# Patient Record
Sex: Male | Born: 1955 | Race: White | Hispanic: No | Marital: Married | Smoking: Never smoker
Health system: Southern US, Community
[De-identification: ages and names within clinical notes are randomized; demographics above are authoritative.]

## PROBLEM LIST (undated history)

## (undated) DIAGNOSIS — J189 Pneumonia, unspecified organism: Secondary | ICD-10-CM

## (undated) DIAGNOSIS — M1990 Unspecified osteoarthritis, unspecified site: Secondary | ICD-10-CM

## (undated) DIAGNOSIS — Z8619 Personal history of other infectious and parasitic diseases: Secondary | ICD-10-CM

## (undated) DIAGNOSIS — K219 Gastro-esophageal reflux disease without esophagitis: Secondary | ICD-10-CM

## (undated) DIAGNOSIS — R519 Headache, unspecified: Secondary | ICD-10-CM

## (undated) DIAGNOSIS — E7800 Pure hypercholesterolemia, unspecified: Secondary | ICD-10-CM

## (undated) DIAGNOSIS — Z9109 Other allergy status, other than to drugs and biological substances: Secondary | ICD-10-CM

## (undated) DIAGNOSIS — G8929 Other chronic pain: Secondary | ICD-10-CM

## (undated) DIAGNOSIS — R42 Dizziness and giddiness: Secondary | ICD-10-CM

## (undated) DIAGNOSIS — G4730 Sleep apnea, unspecified: Secondary | ICD-10-CM

## (undated) DIAGNOSIS — F419 Anxiety disorder, unspecified: Secondary | ICD-10-CM

## (undated) DIAGNOSIS — R51 Headache: Secondary | ICD-10-CM

## (undated) HISTORY — PX: APPENDECTOMY: SHX54

## (undated) HISTORY — DX: Other chronic pain: G89.29

## (undated) HISTORY — DX: Dizziness and giddiness: R42

## (undated) HISTORY — DX: Headache: R51

## (undated) HISTORY — PX: EYE SURGERY: SHX253

## (undated) HISTORY — PX: ROTATOR CUFF REPAIR: SHX139

## (undated) HISTORY — DX: Headache, unspecified: R51.9

## (undated) HISTORY — DX: Anxiety disorder, unspecified: F41.9

## (undated) HISTORY — DX: Sleep apnea, unspecified: G47.30

## (undated) HISTORY — DX: Other allergy status, other than to drugs and biological substances: Z91.09

## (undated) HISTORY — PX: OTHER SURGICAL HISTORY: SHX169

## (undated) HISTORY — DX: Pure hypercholesterolemia, unspecified: E78.00

---

## 2001-11-17 ENCOUNTER — Encounter: Admission: RE | Admit: 2001-11-17 | Discharge: 2001-11-17 | Payer: Self-pay | Admitting: Internal Medicine

## 2001-11-17 ENCOUNTER — Encounter: Payer: Self-pay | Admitting: Internal Medicine

## 2005-09-16 ENCOUNTER — Encounter: Admission: RE | Admit: 2005-09-16 | Discharge: 2005-09-16 | Payer: Self-pay | Admitting: Sports Medicine

## 2005-10-08 ENCOUNTER — Encounter: Admission: RE | Admit: 2005-10-08 | Discharge: 2005-10-08 | Payer: Self-pay | Admitting: Sports Medicine

## 2011-02-10 ENCOUNTER — Ambulatory Visit (INDEPENDENT_AMBULATORY_CARE_PROVIDER_SITE_OTHER): Payer: BC Managed Care – PPO | Admitting: Internal Medicine

## 2011-02-10 ENCOUNTER — Encounter: Payer: Self-pay | Admitting: Internal Medicine

## 2011-02-10 VITALS — BP 120/72 | HR 68 | Ht 74.0 in | Wt 234.6 lb

## 2011-02-10 DIAGNOSIS — G4733 Obstructive sleep apnea (adult) (pediatric): Secondary | ICD-10-CM

## 2011-02-10 NOTE — Patient Instructions (Signed)
Order- new CPAP autotitration for pressure recommendation 5-20 cwp x 2 weeks., mask of choice, humidifier and supplies   Dx OSA

## 2011-02-10 NOTE — Progress Notes (Signed)
02/10/11- 55 yoM never smoker identified with OSA AHI 28.4 on a home unattended test done 11/09/10. Referred by Dr Zenaida Deed for our assessment. His wife has complained of loud snoring and witnessed apnea for years.  He admits poor sleep habits. Unable to on wind for sleep before midnight or 1 AM. Sleep latency 15 minutes. Often spontaneously awake once around 2 AM but otherwise sleeps through the night with nocturia x1 until up at 7:30. No caffeine before noon. Always fights daytime tiredness and has been drowsy driving which we discussed. He estimates he allergies 5-1/2-6 hours of sleep per night. On weekends he gets 8-10 hours. 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 No history of ENT surgery or cardiopulmonary disease. Family history of father with sleep apnea on CPAP.  ROS-see HPI Constitutional:   No-   weight loss, night sweats, fevers, chills, fatigue, lassitude. HEENT:   +  headaches, difficulty swallowing, tooth/dental problems, sore throat,       No-  sneezing, itching, ear ache, nasal congestion, post nasal drip,  CV:  No-   chest pain, orthopnea, PND, swelling in lower extremities, anasarca, dizziness, palpitations Resp: No-   shortness of breath with exertion or at rest.              No-   productive cough,  No non-productive cough,  No- coughing up of blood.              No-   change in color of mucus.  No- wheezing.   Skin: No-   rash or lesions. GI:  No-   heartburn, indigestion, abdominal pain, nausea, vomiting, diarrhea,                 change in bowel habits, loss of appetite GU: MS:  No-   joint pain or swelling.  No- decreased range of motion.  No- back pain. Neuro-     nothing unusual Psych:  No- change in mood or affect. No depression or anxiety.  No memory loss.  OBJ General- Alert, Oriented, Affect-appropriate, Distress- none acute, tall, energetic, not obese Skin- rash-none, lesions- none,  excoriation- none Lymphadenopathy- none Head- atraumatic            Eyes- Gross vision intact, PERRLA, conjunctivae clear secretions            Ears- Hearing, canals-normal            Nose- Clear, no-Septal dev, mucus, polyps, erosion, perforation             Throat- Mallampati III , mucosa clear , drainage- none, tonsils- atrophic Neck- flexible , trachea midline, no stridor , thyroid nl, carotid no bruit Chest - symmetrical excursion , unlabored           Heart/CV- RRR , no murmur , no gallop  , no rub, nl s1 s2                           - JVD- none , edema- none, stasis changes- none, varices- none           Lung- clear to P&A, wheeze- none, cough- none , dullness-none, rub- none           Chest wall-  Abd-  Br/ Gen/ Rectal- Not done, not indicated Extrem- cyanosis- none, clubbing, none, atrophy- none, strength- nl Neuro- grossly intact to observation

## 2011-02-11 NOTE — Assessment & Plan Note (Signed)
We reviewed the physiology and medical concerns of obstructive sleep apnea, his responsibility to drive safely, good sleep hygiene, and treatment options. He wishes to go forward with CPAP. We will auto titrate for pressure guidance.

## 2011-02-17 ENCOUNTER — Telehealth: Payer: Self-pay | Admitting: Internal Medicine

## 2011-02-17 NOTE — Telephone Encounter (Signed)
Called and spoke with Jasmine December at [employer] and they have contacted patient per their records. [facility] stated that they left a message for him to return their call but couldn't verify which phone number they called. [facility] received the order on 02/10/11 and started calling on 02/12/11. [facility] has since mailed out a letter for him to contact them regarding the order.  Per sharon at [facility], she will call patient on mobile number now and try to arrange auto.

## 2011-02-17 NOTE — Telephone Encounter (Signed)
I spoke with [facility] and they state that they never received fax on 02/10/11 and asked if we could refax the order. I spoke with Bjorn Loser and she states she will refax the order over to them.--[email or web address] to make pt aware

## 2011-02-18 NOTE — Telephone Encounter (Signed)
Spoke with Jasmine December at [facility] and patient has been contacted and patient will call Victorino Dike, RT with [facility] in the morning to schedule cpap set up. Pt is aware.

## 2011-02-18 NOTE — Telephone Encounter (Signed)
Pt stated that he has been contacted the [facility] & that everything seems to be getting taken care of now.   Antionette Fairy

## 2011-03-24 ENCOUNTER — Ambulatory Visit: Payer: BC Managed Care – PPO | Admitting: Internal Medicine

## 2011-03-24 ENCOUNTER — Other Ambulatory Visit: Payer: Self-pay | Admitting: Internal Medicine

## 2011-03-24 ENCOUNTER — Ambulatory Visit
Admission: RE | Admit: 2011-03-24 | Discharge: 2011-03-24 | Disposition: A | Discharge status: Home / Self Care | Payer: BC Managed Care – PPO | Source: Ambulatory Visit | Attending: Internal Medicine | Admitting: Internal Medicine

## 2011-03-24 DIAGNOSIS — M1990 Unspecified osteoarthritis, unspecified site: Secondary | ICD-10-CM

## 2011-04-07 ENCOUNTER — Ambulatory Visit (INDEPENDENT_AMBULATORY_CARE_PROVIDER_SITE_OTHER): Payer: BC Managed Care – PPO | Admitting: Internal Medicine

## 2011-04-07 ENCOUNTER — Encounter: Payer: Self-pay | Admitting: Internal Medicine

## 2011-04-07 VITALS — BP 122/80 | HR 52 | Ht 74.0 in | Wt 242.0 lb

## 2011-04-07 DIAGNOSIS — J302 Other seasonal allergic rhinitis: Secondary | ICD-10-CM

## 2011-04-07 DIAGNOSIS — G4733 Obstructive sleep apnea (adult) (pediatric): Secondary | ICD-10-CM

## 2011-04-07 DIAGNOSIS — J309 Allergic rhinitis, unspecified: Secondary | ICD-10-CM

## 2011-04-07 NOTE — Progress Notes (Signed)
02/10/11- 55 yoM never smoker identified with OSA AHI 28.4 on a home unattended test done 11/09/10. Referred by Dr Zenaida Deed for our assessment. His wife has complained of loud snoring and witnessed apnea for years.  He admits poor sleep habits. Unable to on wind for sleep before midnight or 1 AM. Sleep latency 15 minutes. Often spontaneously awake once around 2 AM but otherwise sleeps through the night with nocturia x1 until up at 7:30. No caffeine before noon. Always fights daytime tiredness and has been drowsy driving which we discussed. He estimates he allergies 5-1/2-6 hours of sleep per night. On weekends he gets 8-10 hours. 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 No history of ENT surgery or cardiopulmonary disease. Family history of father with sleep apnea on CPAP.  04/07/11- 55 yoM never smoker identified with OSA AHI 28.4 on a home unattended test done 11/09/10 Very irregular sleep hours uses CPAP every night, estimating 7 hours per night. The initial pressure is too strong for comfort using auto titration. Nasal pillows mask. Anticipates rhinitis and spring weather and we discussed affect of nasal congestion.  ROS-see HPI Constitutional:   No-   weight loss, night sweats, fevers, chills, fatigue, lassitude. HEENT:   +  headaches, difficulty swallowing, tooth/dental problems, sore throat,       No-  sneezing, itching, ear ache,  Seasonal nasal congestion, post nasal drip,  CV:  No-   chest pain, orthopnea, PND, swelling in lower extremities, anasarca, dizziness, palpitations Resp: No-   shortness of breath with exertion or at rest.              No-   productive cough,  No non-productive cough,  No- coughing up of blood.              No-   change in color of mucus.  No- wheezing.   Skin: No-   rash or lesions. GI:  No-   heartburn, indigestion, abdominal pain, nausea, vomiting, diarrhea,                 change in bowel  habits, loss of appetite GU: MS:  No-   joint pain or swelling.    No- back pain. Neuro-     nothing unusual Psych:  No- change in mood or affect. No depression or anxiety.  No memory loss.  OBJ General- Alert, Oriented, Affect-appropriate, Distress- none acute, tall, energetic, not obese Skin- rash-none, lesions- none, excoriation- none Lymphadenopathy- none Head- atraumatic            Eyes- Gross vision intact, PERRLA, conjunctivae clear secretions            Ears- Hearing, canals-normal            Nose- Clear, no-Septal dev, mucus, polyps, erosion, perforation             Throat- Mallampati III , mucosa clear , drainage- none, tonsils- atrophic, loud speech Neck- flexible , trachea midline, no stridor , thyroid nl, carotid no bruit Chest - symmetrical excursion , unlabored           Heart/CV- RRR , no murmur , no gallop  , no rub, nl s1 s2                           - JVD- none , edema- none, stasis changes- none, varices- none  Lung- clear to P&A, wheeze- none, cough- none , dullness-none, rub- none           Chest wall-  Abd-  Br/ Gen/ Rectal- Not done, not indicated Extrem- cyanosis- none, clubbing, none, atrophy- none, strength- nl Neuro- grossly intact to observation

## 2011-04-07 NOTE — Patient Instructions (Addendum)
Order- DME [facility]   Set fixed CPAP at fixed  10 cwp            Teach how to adjust Ramp function  ------------------------------------  Consider trying otc nasal decongestant phenylephrine ( like Sudafed-PE )

## 2011-04-10 DIAGNOSIS — J302 Other seasonal allergic rhinitis: Secondary | ICD-10-CM | POA: Insufficient documentation

## 2011-04-10 NOTE — Assessment & Plan Note (Signed)
This may become more of a problem in the spring pollen season and may affect CPAP comfort. Plan-OTC decongestants and antihistamines if needed. Continue fluticasone nasal spray.

## 2011-04-10 NOTE — Assessment & Plan Note (Signed)
Auto titrating to a recommended pressure of 10. We are going to change to fix pressure 10 so that we can take advantage of ramp. Counseled again on good sleep hygiene.

## 2011-08-04 ENCOUNTER — Encounter: Payer: Self-pay | Admitting: Internal Medicine

## 2011-08-04 ENCOUNTER — Ambulatory Visit (INDEPENDENT_AMBULATORY_CARE_PROVIDER_SITE_OTHER): Payer: BC Managed Care – PPO | Admitting: Internal Medicine

## 2011-08-04 VITALS — BP 102/72 | HR 51 | Ht 74.0 in | Wt 236.0 lb

## 2011-08-04 DIAGNOSIS — G4733 Obstructive sleep apnea (adult) (pediatric): Secondary | ICD-10-CM

## 2011-08-04 DIAGNOSIS — J309 Allergic rhinitis, unspecified: Secondary | ICD-10-CM

## 2011-08-04 DIAGNOSIS — J302 Other seasonal allergic rhinitis: Secondary | ICD-10-CM

## 2011-08-04 NOTE — Patient Instructions (Addendum)
We will continue your CPAP at 10. Please call [facility] or [place] as needed

## 2011-08-04 NOTE — Progress Notes (Signed)
02/10/11- 55 yoM never smoker identified with OSA AHI 28.4 on a home unattended test done 11/09/10. Referred by Dr Zenaida Deed for our assessment. His wife has complained of loud snoring and witnessed apnea for years.  He admits poor sleep habits. Unable to on wind for sleep before midnight or 1 AM. Sleep latency 15 minutes. Often spontaneously awake once around 2 AM but otherwise sleeps through the night with nocturia x1 until up at 7:30. No caffeine before noon. Always fights daytime tiredness and has been drowsy driving which we discussed. He estimates he allergies 5-1/2-6 hours of sleep per night. On weekends he gets 8-10 hours. 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 No history of ENT surgery or cardiopulmonary disease. Family history of father with sleep apnea on CPAP.  04/07/11- 55 yoM never smoker identified with OSA AHI 28.4 on a home unattended test done 11/09/10 Very irregular sleep hours uses CPAP every night, estimating 7 hours per night. The initial pressure is too strong for comfort using auto titration. Nasal pillows mask. Anticipates rhinitis and spring weather and we discussed affect of nasal congestion.  08/04/11- 55 yoM never smoker identified with OSA AHI 28.4 on a home unattended test done 11/09/10 Doing well with fixed CPAP pressure 10/ [facility] and wears CPAP every night for approx 7 hours. He is seeing an allergist for seasonal nasal congestion, treated with Flonase and antihistamines. This is not interfering with his use of CPAP.  ROS-see HPI Constitutional:   No-   weight loss, night sweats, fevers, chills, fatigue, lassitude. HEENT:   +  headaches, difficulty swallowing, tooth/dental problems, sore throat,       No-  sneezing, itching, ear ache, +seasonal nasal congestion, post nasal drip,  CV:  No-   chest pain, orthopnea, PND, swelling in lower extremities, anasarca, dizziness, palpitations Resp: No-    shortness of breath with exertion or at rest.              No-   productive cough,  No non-productive cough,  No- coughing up of blood.              No-   change in color of mucus.  No- wheezing.   Skin: No-   rash or lesions. GI:  No-   heartburn, indigestion, abdominal pain, nausea, vomiting,  GU: MS:  No-   joint pain or swelling.     Neuro-     nothing unusual Psych:  No- change in mood or affect. No depression or anxiety.  No memory loss.  OBJ-BP 102/72  Pulse 51  Ht 6\' 2"  (1.88 m)  Wt 236 lb (107.049 kg)  BMI 30.30 kg/m2  SpO2 99%  General- Alert, Oriented, Affect-appropriate, Distress- none acute, tall, energetic, not obese Skin- rash-none, lesions- none, excoriation- none Lymphadenopathy- none Head- atraumatic            Eyes- Gross vision intact, PERRLA, conjunctivae clear secretions            Ears- Hearing, canals-normal            Nose- Clear, no-Septal dev, mucus, polyps, erosion, perforation             Throat- Mallampati III , mucosa clear , drainage- none, tonsils- atrophic, loud speech Neck- flexible , trachea midline, no stridor , thyroid nl, carotid no bruit Chest - symmetrical excursion , unlabored  Heart/CV- RRR , no murmur , no gallop  , no rub, nl s1 s2                           - JVD- none , edema- none, stasis changes- none, varices- none           Lung- clear to P&A, wheeze- none, cough- none , dullness-none, rub- none           Chest wall-  Abd-  Br/ Gen/ Rectal- Not done, not indicated Extrem- cyanosis- none, clubbing, none, atrophy- none, strength- nl Neuro- grossly intact to observation

## 2011-08-09 NOTE — Assessment & Plan Note (Signed)
Medically managed by Dr Alvin Critchley Allergist

## 2011-08-09 NOTE — Assessment & Plan Note (Signed)
Good compliance and control. No changes required. We have discussed alternatives.

## 2011-12-09 ENCOUNTER — Other Ambulatory Visit: Payer: Self-pay | Admitting: Internal Medicine

## 2011-12-09 DIAGNOSIS — R1310 Dysphagia, unspecified: Secondary | ICD-10-CM

## 2011-12-15 ENCOUNTER — Ambulatory Visit
Admission: RE | Admit: 2011-12-15 | Discharge: 2011-12-15 | Disposition: A | Discharge status: Home / Self Care | Payer: BC Managed Care – PPO | Source: Ambulatory Visit | Attending: Internal Medicine | Admitting: Internal Medicine

## 2011-12-15 DIAGNOSIS — R1310 Dysphagia, unspecified: Secondary | ICD-10-CM

## 2012-02-18 ENCOUNTER — Other Ambulatory Visit: Payer: Self-pay | Admitting: Orthopedic Surgery

## 2012-02-18 DIAGNOSIS — M25511 Pain in right shoulder: Secondary | ICD-10-CM

## 2012-02-21 ENCOUNTER — Ambulatory Visit
Admission: RE | Admit: 2012-02-21 | Discharge: 2012-02-21 | Disposition: A | Discharge status: Home / Self Care | Payer: BC Managed Care – PPO | Source: Ambulatory Visit | Attending: Orthopedic Surgery | Admitting: Orthopedic Surgery

## 2012-02-21 DIAGNOSIS — M25511 Pain in right shoulder: Secondary | ICD-10-CM

## 2012-03-02 ENCOUNTER — Other Ambulatory Visit: Payer: Self-pay | Admitting: Orthopedic Surgery

## 2012-03-02 DIAGNOSIS — M899 Disorder of bone, unspecified: Secondary | ICD-10-CM

## 2012-03-06 ENCOUNTER — Ambulatory Visit
Admission: RE | Admit: 2012-03-06 | Discharge: 2012-03-06 | Disposition: A | Discharge status: Home / Self Care | Payer: BC Managed Care – PPO | Source: Ambulatory Visit | Attending: Orthopedic Surgery | Admitting: Orthopedic Surgery

## 2012-03-06 DIAGNOSIS — M899 Disorder of bone, unspecified: Secondary | ICD-10-CM

## 2012-03-06 MED ORDER — GADOBENATE DIMEGLUMINE 529 MG/ML IV SOLN
20.0000 mL | Freq: Once | INTRAVENOUS | Status: AC | PRN
  Administered 2012-03-06: 20 mL via INTRAVENOUS

## 2012-03-18 DIAGNOSIS — D1600 Benign neoplasm of scapula and long bones of unspecified upper limb: Secondary | ICD-10-CM | POA: Insufficient documentation

## 2012-08-04 ENCOUNTER — Ambulatory Visit: Payer: BC Managed Care – PPO | Admitting: Internal Medicine

## 2012-08-05 ENCOUNTER — Ambulatory Visit: Payer: BC Managed Care – PPO | Admitting: Internal Medicine

## 2012-08-06 ENCOUNTER — Ambulatory Visit (INDEPENDENT_AMBULATORY_CARE_PROVIDER_SITE_OTHER): Payer: BC Managed Care – PPO | Admitting: Internal Medicine

## 2012-08-06 ENCOUNTER — Encounter: Payer: Self-pay | Admitting: Internal Medicine

## 2012-08-06 VITALS — BP 116/72 | HR 59 | Ht 74.0 in | Wt 237.4 lb

## 2012-08-06 DIAGNOSIS — G4733 Obstructive sleep apnea (adult) (pediatric): Secondary | ICD-10-CM

## 2012-08-06 DIAGNOSIS — J309 Allergic rhinitis, unspecified: Secondary | ICD-10-CM

## 2012-08-06 DIAGNOSIS — J302 Other seasonal allergic rhinitis: Secondary | ICD-10-CM

## 2012-08-06 NOTE — Progress Notes (Signed)
02/10/11- 55 yoM never smoker identified with OSA AHI 28.4 on a home unattended test done 11/09/10. Referred by Dr Zenaida Deed for our assessment. His wife has complained of loud snoring and witnessed apnea for years.  He admits poor sleep habits. Unable to on wind for sleep before midnight or 1 AM. Sleep latency 15 minutes. Often spontaneously awake once around 2 AM but otherwise sleeps through the night with nocturia x1 until up at 7:30. No caffeine before noon. Always fights daytime tiredness and has been drowsy driving which we discussed. He estimates he allergies 5-1/2-6 hours of sleep per night. On weekends he gets 8-10 hours. 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 No history of ENT surgery or cardiopulmonary disease. Family history of father with sleep apnea on CPAP.  04/07/11- 55 yoM never smoker identified with OSA AHI 28.4 on a home unattended test done 11/09/10 Very irregular sleep hours uses CPAP every night, estimating 7 hours per night. The initial pressure is too strong for comfort using auto titration. Nasal pillows mask. Anticipates rhinitis and spring weather and we discussed affect of nasal congestion.  08/04/11- 55 yoM never smoker identified with OSA AHI 28.4 on a home unattended test done 11/09/10 Doing well with fixed CPAP pressure 10/ [facility] and wears CPAP every night for approx 7 hours. He is seeing an allergist for seasonal nasal congestion, treated with Flonase and antihistamines. This is not interfering with his use of CPAP.  08/06/12- 55 yoM never smoker identified with OSA AHI 28.4 on a home unattended test done 11/09/10 Sees allergist Dr Irena Cords for [facility]. Yearly Follow up.  Wearing cpap 10/ [facility] every night for approx 7 hours.  No problems with mask or pressure at this time.  ROS-see HPI Constitutional:   No-   weight loss, night sweats, fevers, chills, fatigue, lassitude. HEENT:   +  headaches,  difficulty swallowing, tooth/dental problems, sore throat,       No-  sneezing, itching, ear ache, +seasonal nasal congestion, post nasal drip,  CV:  No-   chest pain, orthopnea, PND, swelling in lower extremities, anasarca, dizziness, palpitations Resp: No-   shortness of breath with exertion or at rest.              No-   productive cough,  No non-productive cough,  No- coughing up of blood.              No-   change in color of mucus.  No- wheezing.   Skin: No-   rash or lesions. GI:  No-   heartburn, indigestion, abdominal pain, nausea, vomiting,  GU: MS:  No-   joint pain or swelling.     Neuro-     nothing unusual Psych:  No- change in mood or affect. No depression or anxiety.  No memory loss.  OBJ- General- Alert, Oriented, Affect-appropriate, Distress- none acute, tall, energetic, not obese Skin- rash-none, lesions- none, excoriation- none Lymphadenopathy- none Head- atraumatic            Eyes- Gross vision intact, PERRLA, conjunctivae clear secretions            Ears- Hearing, canals-normal            Nose- Clear, no-Septal dev, mucus, polyps, erosion, perforation             Throat- Mallampati III , mucosa clear , drainage- none, tonsils- atrophic, loud speech Neck- flexible ,  trachea midline, no stridor , thyroid nl, carotid no bruit Chest - symmetrical excursion , unlabored           Heart/CV- RRR , no murmur , no gallop  , no rub, nl s1 s2                           - JVD- none , edema- none, stasis changes- none, varices- none           Lung- clear to P&A, wheeze- none, cough- none , dullness-none, rub- none           Chest wall-  Abd-  Br/ Gen/ Rectal- Not done, not indicated Extrem- cyanosis- none, clubbing, none, atrophy- none, strength- nl Neuro- grossly intact to observation

## 2012-08-22 NOTE — Assessment & Plan Note (Signed)
Good compliance and control 

## 2012-08-22 NOTE — Patient Instructions (Addendum)
We can continue CPAP 10/[facility]

## 2013-08-11 ENCOUNTER — Encounter (INDEPENDENT_AMBULATORY_CARE_PROVIDER_SITE_OTHER): Payer: Self-pay

## 2013-08-11 ENCOUNTER — Ambulatory Visit (INDEPENDENT_AMBULATORY_CARE_PROVIDER_SITE_OTHER): Payer: BC Managed Care – PPO | Admitting: Internal Medicine

## 2013-08-11 ENCOUNTER — Encounter: Payer: Self-pay | Admitting: Internal Medicine

## 2013-08-11 VITALS — BP 110/72 | HR 52 | Ht 74.0 in | Wt 240.0 lb

## 2013-08-11 DIAGNOSIS — G4733 Obstructive sleep apnea (adult) (pediatric): Secondary | ICD-10-CM

## 2013-08-11 NOTE — Assessment & Plan Note (Signed)
Good compliance and control. 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

## 2013-08-11 NOTE — Progress Notes (Signed)
02/10/11- 72 yoM never smoker identified with OSA AHI 28.4 on a home unattended test done 11/09/10. Referred by Dr Janeice Robinson for our assessment. His wife has complained of loud snoring and witnessed apnea for years.  He admits poor sleep habits. Unable to on wind for sleep before midnight or 1 AM. Sleep latency 15 minutes. Often spontaneously awake once around 2 AM but otherwise sleeps through the night with nocturia x1 until up at 7:30. No caffeine before noon. Always fights daytime tiredness and has been drowsy driving which we discussed. He estimates he allergies 5-1/2-6 hours of sleep per night. On weekends he gets 8-10 hours. 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 No history of ENT surgery or cardiopulmonary disease. Family history of father with sleep apnea on CPAP.  04/07/11- 55 yoM never smoker identified with OSA AHI 28.4 on a home unattended test done 11/09/10 Very irregular sleep hours uses CPAP every night, estimating 7 hours per night. The initial pressure is too strong for comfort using auto titration. Nasal pillows mask. Anticipates rhinitis and spring weather and we discussed affect of nasal congestion.  08/04/11- 89 yoM never smoker identified with OSA AHI 28.4 on a home unattended test done 11/09/10 Doing well with fixed CPAP pressure 10/ [facility] and wears CPAP every night for approx 7 hours. He is seeing an allergist for seasonal nasal congestion, treated with Flonase and antihistamines. This is not interfering with his use of CPAP.  08/06/12- 10 yoM never smoker identified with OSA AHI 28.4 on a home unattended test done 11/09/10 Sees allergist Dr Harold Hedge for [facility]. Yearly Follow up.  Wearing cpap 10/ [facility] every night for approx 7 hours.  No problems with mask or pressure at this time.  08/11/13- 8 yoM never smoker identified with OSA AHI 28.4 on a home unattended test done 11/09/10 Sees allergist Dr Harold Hedge  for [facility]. FOLLOWS FOR: Wears CPAP 10/ [facility] every night for about 7 hours; pressure working well for patient. DME is [facility] and patient states he has not heard from them in about 3 years. Denies any troubles with allergies Was off CPAP for a while after right shoulder surgery - he couldn't lie down comfortably. Doing much better. Weight has been stable. CPAP compliance is general and with no reports of snoring. Nasal pillows mask. He has gotten supplies online with no contact from [facility].  ROS-see HPI Constitutional:   No-   weight loss, night sweats, fevers, chills, fatigue, lassitude. HEENT:   +  headaches, difficulty swallowing, tooth/dental problems, sore throat,       No-  sneezing, itching, ear ache, +seasonal nasal congestion, post nasal drip,  CV:  No-   chest pain, orthopnea, PND, swelling in lower extremities, anasarca, dizziness, palpitations Resp: No-   shortness of breath with exertion or at rest.              No-   productive cough,  No non-productive cough,  No- coughing up of blood.              No-   change in color of mucus.  No- wheezing.   Skin: No-   rash or lesions. GI:  No-   heartburn, indigestion, abdominal pain, nausea, vomiting,  GU: MS:  No-   joint pain or swelling.     Neuro-     nothing unusual Psych:  No- change in mood or affect. No depression  or anxiety.  No memory loss.  OBJ- General- Alert, Oriented, Affect-appropriate, Distress- none acute, tall, energetic, not obese Skin- rash-none, lesions- none, excoriation- none Lymphadenopathy- none Head- atraumatic            Eyes- Gross vision intact, PERRLA, conjunctivae clear secretions            Ears- Hearing, canals-normal            Nose- Clear, no-Septal dev, mucus, polyps, erosion, perforation             Throat- Mallampati III , mucosa clear , drainage- none, tonsils- atrophic, loud speech Neck- flexible , trachea midline, no stridor , thyroid nl, carotid no bruit Chest - symmetrical excursion ,  unlabored           Heart/CV- RRR , no murmur , no gallop  , no rub, nl s1 s2                           - JVD- none , edema- none, stasis changes- none, varices- none           Lung- clear to P&A, wheeze- none, cough- none , dullness-none, rub- none           Chest wall-  Abd-  Br/ Gen/ Rectal- Not done, not indicated Extrem- cyanosis- none, clubbing, none, atrophy- none, strength- nl Neuro- grossly intact to observation

## 2013-08-11 NOTE — Patient Instructions (Signed)
Order- DME [facility]- download for pressure compliance. Please arrange for this about a month from now to give time for shoulder surgery to heal   Dx OSA

## 2013-09-26 ENCOUNTER — Other Ambulatory Visit: Payer: Self-pay | Admitting: Orthopedic Surgery

## 2013-09-26 DIAGNOSIS — M25511 Pain in right shoulder: Secondary | ICD-10-CM

## 2013-10-02 ENCOUNTER — Ambulatory Visit
Admission: RE | Admit: 2013-10-02 | Discharge: 2013-10-02 | Disposition: A | Discharge status: Home / Self Care | Payer: BC Managed Care – PPO | Source: Ambulatory Visit | Attending: Orthopedic Surgery | Admitting: Orthopedic Surgery

## 2013-10-02 DIAGNOSIS — M25511 Pain in right shoulder: Secondary | ICD-10-CM

## 2014-03-29 DIAGNOSIS — H2702 Aphakia, left eye: Secondary | ICD-10-CM | POA: Insufficient documentation

## 2014-05-25 ENCOUNTER — Encounter (HOSPITAL_COMMUNITY): Payer: Self-pay | Admitting: Emergency Medicine

## 2014-05-25 ENCOUNTER — Emergency Department (HOSPITAL_COMMUNITY): Payer: Commercial Indemnity

## 2014-05-25 ENCOUNTER — Emergency Department (HOSPITAL_COMMUNITY)
Admission: EM | Admit: 2014-05-25 | Discharge: 2014-05-25 | Disposition: A | Discharge status: Home / Self Care | Payer: Commercial Indemnity | Attending: Emergency Medicine | Admitting: Emergency Medicine

## 2014-05-25 DIAGNOSIS — R200 Anesthesia of skin: Secondary | ICD-10-CM | POA: Insufficient documentation

## 2014-05-25 DIAGNOSIS — Z8669 Personal history of other diseases of the nervous system and sense organs: Secondary | ICD-10-CM | POA: Diagnosis not present

## 2014-05-25 DIAGNOSIS — R202 Paresthesia of skin: Secondary | ICD-10-CM | POA: Diagnosis not present

## 2014-05-25 DIAGNOSIS — Z7982 Long term (current) use of aspirin: Secondary | ICD-10-CM | POA: Diagnosis not present

## 2014-05-25 DIAGNOSIS — R55 Syncope and collapse: Secondary | ICD-10-CM | POA: Diagnosis not present

## 2014-05-25 DIAGNOSIS — Z79899 Other long term (current) drug therapy: Secondary | ICD-10-CM | POA: Diagnosis not present

## 2014-05-25 DIAGNOSIS — G459 Transient cerebral ischemic attack, unspecified: Secondary | ICD-10-CM | POA: Diagnosis not present

## 2014-05-25 DIAGNOSIS — E780 Pure hypercholesterolemia: Secondary | ICD-10-CM | POA: Diagnosis not present

## 2014-05-25 DIAGNOSIS — Z7951 Long term (current) use of inhaled steroids: Secondary | ICD-10-CM | POA: Diagnosis not present

## 2014-05-25 DIAGNOSIS — R42 Dizziness and giddiness: Secondary | ICD-10-CM | POA: Diagnosis not present

## 2014-05-25 LAB — COMPREHENSIVE METABOLIC PANEL
ALBUMIN: 4.3 g/dL (ref 3.5–5.2)
ALK PHOS: 39 U/L (ref 39–117)
ALT: 18 U/L (ref 0–53)
ANION GAP: 9 (ref 5–15)
AST: 20 U/L (ref 0–37)
BUN: 9 mg/dL (ref 6–23)
CO2: 28 mmol/L (ref 19–32)
Calcium: 9.3 mg/dL (ref 8.4–10.5)
Chloride: 101 mmol/L (ref 96–112)
Creatinine, Ser: 1.26 mg/dL (ref 0.50–1.35)
GFR calc Af Amer: 71 mL/min — ABNORMAL LOW (ref >90)
GFR calc non Af Amer: 61 mL/min — ABNORMAL LOW (ref >90)
Glucose, Bld: 96 mg/dL (ref 70–99)
Potassium: 4 mmol/L (ref 3.5–5.1)
Sodium: 138 mmol/L (ref 135–145)
Total Bilirubin: 0.6 mg/dL (ref 0.3–1.2)
Total Protein: 6.9 g/dL (ref 6.0–8.3)

## 2014-05-25 LAB — RAPID URINE DRUG SCREEN, HOSP PERFORMED
AMPHETAMINES: NOT DETECTED
Barbiturates: NOT DETECTED
Benzodiazepines: NOT DETECTED
Cocaine: NOT DETECTED
OPIATES: NOT DETECTED
Tetrahydrocannabinol: NOT DETECTED

## 2014-05-25 LAB — ETHANOL: Alcohol, Ethyl (B): <5 mg/dL (ref 0–9)

## 2014-05-25 LAB — URINALYSIS, ROUTINE W REFLEX MICROSCOPIC
Bilirubin Urine: NEGATIVE
GLUCOSE, UA: NEGATIVE mg/dL
Hgb urine dipstick: NEGATIVE
Ketones, ur: NEGATIVE mg/dL
LEUKOCYTES UA: NEGATIVE
Nitrite: NEGATIVE
PROTEIN: NEGATIVE mg/dL
SPECIFIC GRAVITY, URINE: 1.003 — AB (ref 1.005–1.030)
Urobilinogen, UA: 0.2 mg/dL (ref 0.0–1.0)
pH: 7 (ref 5.0–8.0)

## 2014-05-25 LAB — CBC
HCT: 45 % (ref 39.0–52.0)
Hemoglobin: 15.4 g/dL (ref 13.0–17.0)
MCH: 30.9 pg (ref 26.0–34.0)
MCHC: 34.2 g/dL (ref 30.0–36.0)
MCV: 90.2 fL (ref 78.0–100.0)
Platelets: 197 10*3/uL (ref 150–400)
RBC: 4.99 MIL/uL (ref 4.22–5.81)
RDW: 13 % (ref 11.5–15.5)
WBC: 5.5 10*3/uL (ref 4.0–10.5)

## 2014-05-25 LAB — I-STAT CHEM 8, ED
BUN: 10 mg/dL (ref 6–23)
CALCIUM ION: 1.18 mmol/L (ref 1.12–1.23)
CREATININE: 1.2 mg/dL (ref 0.50–1.35)
Chloride: 98 mmol/L (ref 96–112)
GLUCOSE: 94 mg/dL (ref 70–99)
HCT: 47 % (ref 39.0–52.0)
HEMOGLOBIN: 16 g/dL (ref 13.0–17.0)
Potassium: 3.9 mmol/L (ref 3.5–5.1)
Sodium: 138 mmol/L (ref 135–145)
TCO2: 24 mmol/L (ref 0–100)

## 2014-05-25 LAB — DIFFERENTIAL
Basophils Absolute: 0.1 10*3/uL (ref 0.0–0.1)
Basophils Relative: 1 % (ref 0–1)
EOS ABS: 0.1 10*3/uL (ref 0.0–0.7)
Eosinophils Relative: 2 % (ref 0–5)
LYMPHS PCT: 33 % (ref 12–46)
Lymphs Abs: 1.8 10*3/uL (ref 0.7–4.0)
Monocytes Absolute: 0.6 10*3/uL (ref 0.1–1.0)
Monocytes Relative: 11 % (ref 3–12)
Neutro Abs: 3 10*3/uL (ref 1.7–7.7)
Neutrophils Relative %: 53 % (ref 43–77)

## 2014-05-25 LAB — I-STAT TROPONIN, ED: TROPONIN I, POC: 0 ng/mL (ref 0.00–0.08)

## 2014-05-25 LAB — PROTIME-INR
INR: 1.03 (ref 0.00–1.49)
Prothrombin Time: 13.6 seconds (ref 11.6–15.2)

## 2014-05-25 LAB — APTT: APTT: 29 s (ref 24–37)

## 2014-05-25 NOTE — ED Notes (Addendum)
SCANNER 2 READY

## 2014-05-25 NOTE — Discharge Instructions (Signed)

## 2014-05-25 NOTE — ED Notes (Addendum)
PT was sitting at a desk on phone; felt numbness in left arm at 0930 and almost passed out. 10 seconds later came to and felt clear headed again. States he has had episodes of this in past. States he is generally healthy guy and had workup for this and been told nothing wrong. States this is the worst of these episodes. PA at bedside. Pt denies chest pain; SOB or vomiting with this. Still complaining of left arm numbness; Code Stroke called.

## 2014-05-25 NOTE — Consult Note (Signed)
Referring Physician: Dr. Darl Householder    Chief Complaint: code stroke, dizziness, left arm numbness  HPI:                                                                                                                                         Corey Hensley is an 59 y.o. male, right handed, with a past medical history significant for hypercholesterolemia, episodic migraine without aura, and OSA, comes in for further evaluation of the above stated symptoms. He said that he was in his usual state of health until approximately 9:35 when he was sitting in a chair finishing a phone conversation at work when suddenly became dizzy like he was about to faint and later on his left arm became numb. No associated weakness, HA, double vision, difficulty swallowing, slurred speech, language or vision impairment. Further, denies getting sweaty or clammy with this presentation. No neck pain but said that he has mild degenerative changes in his neck. [name] tells me that he had had numbness in the left arm and dizziness when standing up quickly but doesn't recall if he had had both symptoms together as happened today. Patient said that the numbness is going away and he is not dizzy anymore. Of importance, he expressed that he is very health conscious and has been under lot of stress at work. NIHSS 0. CT brain was personally reviewed and showed no acute abnormality.  Date last known well:  Time last known well:  tPA Given:no, NIHSS 0 NIHSS: 0 MRS: 0  Past Medical History  Diagnosis Date  . High cholesterol   . Environmental allergies   . Chronic headaches   . Sleep apnea     Past Surgical History  Procedure Laterality Date  . Appendectomy    . Knee/ankle surgeries      Family History  Problem Relation Age of Onset  . Allergies Son   . Allergies Sister   . Heart disease Father   . Stomach cancer Paternal Grandmother   . Rectal cancer Maternal Grandmother    Social History:  reports that he has never  smoked. He does not have any smokeless tobacco history on file. He reports that he drinks about 1.0 oz of alcohol per week. He reports that he does not use illicit drugs. Family history: no epilepsy, brain tumors, or brain aneurysms. Allergies:  Allergies  Allergen Reactions  . Sulfa Antibiotics     Medications:  I have reviewed the patient's current medications.  ROS:                                                                                                                                       History obtained from the patient  General ROS: negative for - chills, fatigue, fever, night sweats, weight gain or weight loss Psychological ROS: negative for - behavioral disorder, hallucinations, memory difficulties, mood swings or suicidal ideation Ophthalmic ROS: negative for - blurry vision, double vision, eye pain or loss of vision ENT ROS: negative for - epistaxis, nasal discharge, oral lesions, sore throat, tinnitus or vertigo Allergy and Immunology ROS: negative for - hives or itchy/watery eyes Hematological and Lymphatic ROS: negative for - bleeding problems, bruising or swollen lymph nodes Endocrine ROS: negative for - galactorrhea, hair pattern changes, polydipsia/polyuria or temperature intolerance Respiratory ROS: negative for - cough, hemoptysis, shortness of breath or wheezing Cardiovascular ROS: negative for - chest pain, dyspnea on exertion, edema or irregular heartbeat Gastrointestinal ROS: negative for - abdominal pain, diarrhea, hematemesis, nausea/vomiting or stool incontinence Genito-Urinary ROS: negative for - dysuria, hematuria, incontinence or urinary frequency/urgency Musculoskeletal ROS: negative for - joint swelling or muscular weakness Neurological ROS: as noted in HPI Dermatological ROS: negative for rash and skin lesion changes  Physical  exam: pleasant male in no apparent distress. BP 130/80 P 82 R 17, afebrile. Eyes: normal conjunctiva, no proptosis. Head: normocephalic. Neck: supple, no bruits, no JVD. Cardiac: no murmurs. Lungs: clear. Abdomen: soft, no tender, no mass. Extremities: no edema. Skin: no rash Neurologic Examination:                                                                                                      General: Mental Status: Alert, oriented, thought content appropriate.  Speech fluent without evidence of aphasia.  Able to follow 3 step commands without difficulty. Cranial Nerves: II: Discs flat bilaterally; Visual fields grossly normal, pupils equal, round, reactive to light and accommodation III,IV, VI: ptosis not present, extra-ocular motions intact bilaterally V,VII: smile symmetric, facial light touch sensation normal bilaterally VIII: hearing normal bilaterally IX,X: uvula rises symmetrically XI: bilateral shoulder shrug XII: midline tongue extension without atrophy or fasciculations  Motor: Right : Upper extremity   5/5    Left:     Upper extremity   5/5  Lower extremity   5/5     Lower extremity   5/5 Tone and bulk:normal tone throughout; no atrophy noted Sensory: Pinprick and  light touch intact throughout, bilaterally Deep Tendon Reflexes:  Right: Upper Extremity   Left: Upper extremity   biceps (C-5 to C-6) 2/4   biceps (C-5 to C-6) 2/4 tricep (C7) 2/4    triceps (C7) 2/4 Brachioradialis (C6) 2/4  Brachioradialis (C6) 2/4  Lower Extremity Lower Extremity  quadriceps (L-2 to L-4) 2/4   quadriceps (L-2 to L-4) 2/4 Achilles (S1) 2/4   Achilles (S1) 2/4  Plantars: Right: downgoing   Left: downgoing Cerebellar: normal finger-to-nose,  normal heel-to-shin test Gait:  No tested due to multiple leads.      Results for orders placed or performed during the hospital encounter of 05/25/14 (from the past 48 hour(s))  CBC     Status: None   Collection Time: 05/25/14 11:30  AM  Result Value Ref Range   WBC 5.5 4.0 - 10.5 K/uL   RBC 4.99 4.22 - 5.81 MIL/uL   Hemoglobin 15.4 13.0 - 17.0 g/dL   HCT 45.0 39.0 - 52.0 %   MCV 90.2 78.0 - 100.0 fL   MCH 30.9 26.0 - 34.0 pg   MCHC 34.2 30.0 - 36.0 g/dL   RDW 13.0 11.5 - 15.5 %   Platelets 197 150 - 400 K/uL  Differential     Status: None   Collection Time: 05/25/14 11:30 AM  Result Value Ref Range   Neutrophils Relative % 53 43 - 77 %   Neutro Abs 3.0 1.7 - 7.7 K/uL   Lymphocytes Relative 33 12 - 46 %   Lymphs Abs 1.8 0.7 - 4.0 K/uL   Monocytes Relative 11 3 - 12 %   Monocytes Absolute 0.6 0.1 - 1.0 K/uL   Eosinophils Relative 2 0 - 5 %   Eosinophils Absolute 0.1 0.0 - 0.7 K/uL   Basophils Relative 1 0 - 1 %   Basophils Absolute 0.1 0.0 - 0.1 K/uL  I-Stat Troponin, ED (not at [facility])     Status: None   Collection Time: 05/25/14 11:47 AM  Result Value Ref Range   Troponin i, poc 0.00 0.00 - 0.08 ng/mL   Comment 3            Comment: Due to the release kinetics of cTnI, a negative result within the first hours of the onset of symptoms does not rule out myocardial infarction with certainty. If myocardial infarction is still suspected, repeat the test at appropriate intervals.    Ct Head Wo Contrast  05/25/2014   CLINICAL DATA:  Acute onset left arm numbness and dizziness  EXAM: CT HEAD WITHOUT CONTRAST  TECHNIQUE: Contiguous axial images were obtained from the base of the skull through the vertex without intravenous contrast.  COMPARISON:  None.  FINDINGS: The ventricles are normal in size and configuration. There is no intracranial mass, hemorrhage, extra-axial fluid collection, or midline shift. The gray-white compartments are normal. No acute infarct evident. Bony calvarium appears intact. The visualized mastoid air cells are clear. The middle cerebral arteries show symmetric and normal attenuation bilaterally.  IMPRESSION: No intracranial mass, hemorrhage, or focal gray -white compartment lesions/acute  appearing infarct.  Critical Value/emergent results were called by telephone at the time of interpretation on 05/25/2014 at 11:51 am to Dr. Aram Beecham, neurology , who verbally acknowledged these results.   Electronically Signed   By: Lowella Grip III M.D.   On: 05/25/2014 11:52     Assessment: 59 y.o. male presents to the ED with complains of sudden dizziness (resolved) and subjective left arm numbness (improving). NIHSS 0,  CT brain personally reviewed demonstrated no acute abnormality. Of note, patient had had similar symptoms before. Can not entirely exclude TIA, as he has no neck pain, had dizziness, and the distribution of numbness is not quite suggestive of a cervical radiculopathy. Patient very claustrophobic and stated that he can not get MRI because it will trigger a panic attack. Discussed with patient about observing him overnight and he said he will prefer not to stay in the hospital tonight and instead have further neurological testing as outpatient. Will suggest aspirin 81 mg.  He was advised to return to the ED immediately if his symptoms recur.   Stroke Risk Factors - hyperlipidemia.   Dorian Pod ,MD [facility] [phone] 05/25/2014, 12:02 PM

## 2014-05-25 NOTE — ED Notes (Signed)
Dr. Yao at bedside. 

## 2014-05-25 NOTE — ED Provider Notes (Signed)
CSN: [id number]     Arrival date & time 05/25/14  1111 History   First MD Initiated Contact with Patient 05/25/14 1116     Chief Complaint  Patient presents with  . Near Syncope  . Code Stroke    An emergency department physician performed an initial assessment on this suspected stroke patient at 18. (Consider location/radiation/quality/duration/timing/severity/associated sxs/prior Treatment) HPI Comments: Patient presents to the ED with a chief complaint of left arm numbness.  Patient states that this morning at around 9:30 he was at home sitting at a desk on the phone, when he reports having a near syncopal pole episode. Patient states that he became very lightheaded, but did not actually pass out. Patient states that when the lightheadedness cleared, he had left arm numbness. He denies any weakness of his extremities. Denies any slurred speech. Denies any difficulty balancing. He denies any fevers, chills, cough, chest pain, shortness breath, nausea, vomiting, diarrhea, abdominal pain, or any other symptoms save for the left arm numbness. Patient has had these symptoms before about 20 years ago. He has no history of stroke. No history of A. fib.  The history is provided by the patient. No language interpreter was used.    Past Medical History  Diagnosis Date  . High cholesterol   . Environmental allergies   . Chronic headaches   . Sleep apnea    Past Surgical History  Procedure Laterality Date  . Appendectomy    . Knee/ankle surgeries     Family History  Problem Relation Age of Onset  . Allergies Son   . Allergies Sister   . Heart disease Father   . Stomach cancer Paternal Grandmother   . Rectal cancer Maternal Grandmother    History  Substance Use Topics  . Smoking status: Never Smoker   . Smokeless tobacco: Not on file  . Alcohol Use: 1.0 oz/week    2 drink(s) per week    Review of Systems  Constitutional: Negative for fever and chills.  Respiratory: Negative for  shortness of breath.   Cardiovascular: Negative for chest pain.  Gastrointestinal: Negative for nausea, vomiting, diarrhea and constipation.  Genitourinary: Negative for dysuria.  Neurological: Positive for light-headedness and numbness.  All other systems reviewed and are negative.     Allergies  Sulfa antibiotics  Home Medications   Prior to Admission medications   Medication Sig Start Date End Date Taking? Authorizing Provider  aspirin 81 MG tablet Take 81 mg by mouth daily.    Yes Historical Provider, MD  calcium carbonate (OS-CAL) 600 MG TABS Take 600 mg by mouth daily.   Yes Historical Provider, MD  cholecalciferol (VITAMIN D) 1000 UNITS tablet Take 1,000 Units by mouth daily.   Yes Historical Provider, MD  fexofenadine (ALLEGRA) 180 MG tablet Take 180 mg by mouth daily.   Yes Historical Provider, MD  fluticasone (FLONASE) 50 MCG/ACT nasal spray Place 1 spray into both nostrils Daily.  03/14/11  Yes Historical Provider, MD  lovastatin (MEVACOR) 20 MG tablet Take 1 tablet by mouth daily. 12/22/10  Yes Historical Provider, MD  Multiple Vitamin (MULTIVITAMIN) tablet Take 1 tablet by mouth daily.   Yes Historical Provider, MD  vitamin B-12 (CYANOCOBALAMIN) 1000 MCG tablet Take 1,000 mcg by mouth daily.   Yes Historical Provider, MD  PATADAY 0.2 % SOLN as needed. 03/14/11   Historical Provider, MD   There were no vitals taken for this visit. Physical Exam  Constitutional: He is oriented to person, place, and time. He  appears well-developed and well-nourished.  HENT:  Head: Normocephalic and atraumatic.  Eyes: Conjunctivae and EOM are normal. Pupils are equal, round, and reactive to light. Right eye exhibits no discharge. Left eye exhibits no discharge. No scleral icterus.  Neck: Normal range of motion. Neck supple. No JVD present.  Cardiovascular: Normal rate, regular rhythm and normal heart sounds.  Exam reveals no gallop and no friction rub.   No murmur heard. Pulmonary/Chest:  Effort normal and breath sounds normal. No respiratory distress. He has no wheezes. He has no rales. He exhibits no tenderness.  Abdominal: Soft. He exhibits no distension and no mass. There is no tenderness. There is no rebound and no guarding.  Musculoskeletal: Normal range of motion. He exhibits no edema or tenderness.  Range of motion and strength intact throughout  Neurological: He is alert and oriented to person, place, and time.  Left arm paresthesia, no weakness CN III-12 intact No pronator drift Normal finger to nose Normal heel shin Normal gait Strength intact throughout  Skin: Skin is warm and dry.  Psychiatric: He has a normal mood and affect. His behavior is normal. Judgment and thought content normal.  Nursing note and vitals reviewed.   ED Course  Procedures (including critical care time) Results for orders placed or performed during the hospital encounter of 05/25/14  Ethanol  Result Value Ref Range   Alcohol, Ethyl (B) <5 0 - 9 mg/dL  Protime-INR  Result Value Ref Range   Prothrombin Time 13.6 11.6 - 15.2 seconds   INR 1.03 0.00 - 1.49  APTT  Result Value Ref Range   aPTT 29 24 - 37 seconds  CBC  Result Value Ref Range   WBC 5.5 4.0 - 10.5 K/uL   RBC 4.99 4.22 - 5.81 MIL/uL   Hemoglobin 15.4 13.0 - 17.0 g/dL   HCT 45.0 39.0 - 52.0 %   MCV 90.2 78.0 - 100.0 fL   MCH 30.9 26.0 - 34.0 pg   MCHC 34.2 30.0 - 36.0 g/dL   RDW 13.0 11.5 - 15.5 %   Platelets 197 150 - 400 K/uL  Differential  Result Value Ref Range   Neutrophils Relative % 53 43 - 77 %   Neutro Abs 3.0 1.7 - 7.7 K/uL   Lymphocytes Relative 33 12 - 46 %   Lymphs Abs 1.8 0.7 - 4.0 K/uL   Monocytes Relative 11 3 - 12 %   Monocytes Absolute 0.6 0.1 - 1.0 K/uL   Eosinophils Relative 2 0 - 5 %   Eosinophils Absolute 0.1 0.0 - 0.7 K/uL   Basophils Relative 1 0 - 1 %   Basophils Absolute 0.1 0.0 - 0.1 K/uL  Comprehensive metabolic panel  Result Value Ref Range   Sodium 138 135 - 145 mmol/L    Potassium 4.0 3.5 - 5.1 mmol/L   Chloride 101 96 - 112 mmol/L   CO2 28 19 - 32 mmol/L   Glucose, Bld 96 70 - 99 mg/dL   BUN 9 6 - 23 mg/dL   Creatinine, Ser 1.26 0.50 - 1.35 mg/dL   Calcium 9.3 8.4 - 10.5 mg/dL   Total Protein 6.9 6.0 - 8.3 g/dL   Albumin 4.3 3.5 - 5.2 g/dL   AST 20 0 - 37 U/L   ALT 18 0 - 53 U/L   Alkaline Phosphatase 39 39 - 117 U/L   Total Bilirubin 0.6 0.3 - 1.2 mg/dL   GFR calc non Af Amer 61 (L) >90 mL/min   GFR  calc Af Amer 71 (L) >90 mL/min   Anion gap 9 5 - 15  Urine Drug Screen  Result Value Ref Range   Opiates NONE DETECTED NONE DETECTED   Cocaine NONE DETECTED NONE DETECTED   Benzodiazepines NONE DETECTED NONE DETECTED   Amphetamines NONE DETECTED NONE DETECTED   Tetrahydrocannabinol NONE DETECTED NONE DETECTED   Barbiturates NONE DETECTED NONE DETECTED  Urinalysis, Routine w reflex microscopic  Result Value Ref Range   Color, Urine YELLOW YELLOW   APPearance CLEAR CLEAR   Specific Gravity, Urine 1.003 (L) 1.005 - 1.030   pH 7.0 5.0 - 8.0   Glucose, UA NEGATIVE NEGATIVE mg/dL   Hgb urine dipstick NEGATIVE NEGATIVE   Bilirubin Urine NEGATIVE NEGATIVE   Ketones, ur NEGATIVE NEGATIVE mg/dL   Protein, ur NEGATIVE NEGATIVE mg/dL   Urobilinogen, UA 0.2 0.0 - 1.0 mg/dL   Nitrite NEGATIVE NEGATIVE   Leukocytes, UA NEGATIVE NEGATIVE  I-Stat Chem 8, ED  Result Value Ref Range   Sodium 138 135 - 145 mmol/L   Potassium 3.9 3.5 - 5.1 mmol/L   Chloride 98 96 - 112 mmol/L   BUN 10 6 - 23 mg/dL   Creatinine, Ser 1.20 0.50 - 1.35 mg/dL   Glucose, Bld 94 70 - 99 mg/dL   Calcium, Ion 1.18 1.12 - 1.23 mmol/L   TCO2 24 0 - 100 mmol/L   Hemoglobin 16.0 13.0 - 17.0 g/dL   HCT 47.0 39.0 - 52.0 %  I-Stat Troponin, ED (not at [facility])  Result Value Ref Range   Troponin i, poc 0.00 0.00 - 0.08 ng/mL   Comment 3           Ct Head Wo Contrast  05/25/2014   CLINICAL DATA:  Acute onset left arm numbness and dizziness  EXAM: CT HEAD WITHOUT CONTRAST  TECHNIQUE:  Contiguous axial images were obtained from the base of the skull through the vertex without intravenous contrast.  COMPARISON:  None.  FINDINGS: The ventricles are normal in size and configuration. There is no intracranial mass, hemorrhage, extra-axial fluid collection, or midline shift. The gray-white compartments are normal. No acute infarct evident. Bony calvarium appears intact. The visualized mastoid air cells are clear. The middle cerebral arteries show symmetric and normal attenuation bilaterally.  IMPRESSION: No intracranial mass, hemorrhage, or focal gray -white compartment lesions/acute appearing infarct.  Critical Value/emergent results were called by telephone at the time of interpretation on 05/25/2014 at 11:51 am to Dr. Aram Beecham, neurology , who verbally acknowledged these results.   Electronically Signed   By: Lowella Grip III M.D.   On: 05/25/2014 11:52     Imaging Review Ct Head Wo Contrast  05/25/2014   CLINICAL DATA:  Acute onset left arm numbness and dizziness  EXAM: CT HEAD WITHOUT CONTRAST  TECHNIQUE: Contiguous axial images were obtained from the base of the skull through the vertex without intravenous contrast.  COMPARISON:  None.  FINDINGS: The ventricles are normal in size and configuration. There is no intracranial mass, hemorrhage, extra-axial fluid collection, or midline shift. The gray-white compartments are normal. No acute infarct evident. Bony calvarium appears intact. The visualized mastoid air cells are clear. The middle cerebral arteries show symmetric and normal attenuation bilaterally.  IMPRESSION: No intracranial mass, hemorrhage, or focal gray -white compartment lesions/acute appearing infarct.  Critical Value/emergent results were called by telephone at the time of interpretation on 05/25/2014 at 11:51 am to Dr. Aram Beecham, neurology , who verbally acknowledged these results.   Electronically Signed  By: Lowella Grip III M.D.   On: 05/25/2014 11:52     EKG  Interpretation   Date/Time:  Thursday May 25 2014 11:40:43 EDT Ventricular Rate:  56 PR Interval:  172 QRS Duration: 94 QT Interval:  424 QTC Calculation: 409 R Axis:   70 Text Interpretation:  Sinus rhythm No previous ECGs available Confirmed by  YAO  MD, DAVID ([id number]) on 05/25/2014 11:45:05 AM      MDM   Final diagnoses:  Paresthesia    Patient with episode of near syncope and left arm paresthesia. Code stroke activated given onset of symptoms being within 3 hours, however patient has no neurologic deficits except for mild left arm numbness. There are no aggravating or alleviating factors. Patient has had similar symptoms about 30 years ago. He denies any other symptoms now. He is pain-free. Patient seen by neurology Dr. Aram Beecham, who doubts stroke, but recommends follow-up with outpatient neurology.  Patient seen by and discussed with Dr. Darl Householder, who agrees plan.  Paresthesias completely resolved. Will discharge to home per above.      Montine Circle, PA-C 05/25/14 [place] Yao, MD 05/26/14 [phone]

## 2014-06-13 ENCOUNTER — Ambulatory Visit (INDEPENDENT_AMBULATORY_CARE_PROVIDER_SITE_OTHER): Payer: Commercial Indemnity | Admitting: Neurology

## 2014-06-13 ENCOUNTER — Encounter: Payer: Self-pay | Admitting: Neurology

## 2014-06-13 VITALS — BP 112/69 | HR 51 | Ht 74.0 in | Wt 236.0 lb

## 2014-06-13 DIAGNOSIS — R202 Paresthesia of skin: Secondary | ICD-10-CM | POA: Diagnosis not present

## 2014-06-13 NOTE — Progress Notes (Signed)
PATIENT: Corey Hensley DOB: 04/24/1955  HISTORICAL  STEFANO TRULSON is 59 yo RH male, referred by his primary care physician Dr. Mancel Bale for evaluation of one episode of left-sided numbness  In May 25 2014, while at work, at extreme stressful situation, he had a sudden onset slumped over to the left side, no loss of consciousness, but followed by left side numbness, lasting for a few hours, he presented to the emergency room, CAT scan of the brain was normal, laboratory showed normal CBC, CMP, negative uDS   He had a similar episode in the past under stressful situation, few years back, he had extensive cardiac workup because of his recurrent episode of chest pressure, left-sided numbness usually under work-related stressful situation  Today he is also concerned about his memory trouble, his mother suffered dementia, 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 Sulfa Antibiotics     HOME MEDICATIONS: Current Outpatient Prescriptions  Medication Sig Dispense Refill  . ALPRAZolam (XANAX) 0.5 MG tablet   0  . aspirin 81 MG tablet Take 81 mg by mouth daily.     . fexofenadine (ALLEGRA) 180 MG tablet Take 180 mg by mouth daily.    . fluticasone (FLONASE) 50 MCG/ACT nasal spray Place 1 spray into both nostrils Daily.     Marland Kitchen lovastatin (MEVACOR) 20 MG tablet Take 1 tablet by mouth daily.    . Multiple Vitamin (MULTIVITAMIN) tablet Take 1 tablet by mouth daily.    . SUMAtriptan (IMITREX) 50 MG tablet Take 50 mg by mouth as needed for migraine. May repeat in 2 hours if headache persists or recurs.       PAST MEDICAL HISTORY: Past  Medical History  Diagnosis Date  . High cholesterol   . Environmental allergies   . Chronic headaches   . Sleep apnea   . Anxiety   . Dizziness     PAST SURGICAL HISTORY: Past Surgical History  Procedure Laterality Date  . Appendectomy    . Knee/ankle surgeries      FAMILY HISTORY: Family History  Problem Relation Age of Onset  . Allergies Son   . Allergies Sister   . Heart disease Father   . Stomach cancer Paternal Grandmother   . Rectal cancer Maternal Grandmother   . Diabetes Father   . Hypertension Father   . Stroke Father   . Alzheimer's disease Mother     SOCIAL HISTORY:  History   Social History  . Marital Status: Married    Spouse Name: N/A  . Number of Children: 1  . Years of Education: 16   Occupational History  . CFO    Social History Main Topics  . Smoking status: Never Smoker   . Smokeless tobacco: Not on file  . Alcohol Use: 1.2 oz/week    2 Standard drinks or equivalent per week     Comment: 4 drinks per month  . Drug Use: No  . Sexual Activity: Not on file   Other Topics Concern  . Not on file   Social History Narrative   Lives at home with wife.   One cup  caffeine daily.   Right-handed.    PHYSICAL EXAM   Filed Vitals:   06/13/14 1046  BP: 112/69  Pulse: 51  Height: 6\' 2"  (1.88 m)  Weight: 236 lb (107.049 kg)    Not recorded      Body mass index is 30.29 kg/(m^2).  PHYSICAL EXAMNIATION:  Gen: NAD, conversant, well nourised, obese, well groomed                     Cardiovascular: Regular rate rhythm, no peripheral edema, warm, nontender. Eyes: Conjunctivae clear without exudates or hemorrhage Neck: Supple, no carotid bruise. Pulmonary: Clear to auscultation bilaterally   NEUROLOGICAL EXAM:  MENTAL STATUS: Speech:    Speech is normal; fluent and spontaneous with normal comprehension.  Cognition:    The patient is oriented to person, place, and time;     recent and remote memory intact;     language fluent;      normal attention, concentration,     fund of knowledge.  CRANIAL NERVES: CN II: Visual fields are full to confrontation. Fundoscopic exam is normal with sharp discs and no vascular changes. Venous pulsations are present bilaterally. Pupils are 4 mm and briskly reactive to light. Visual acuity is 20/20 bilaterally. CN III, IV, VI: extraocular movement are normal. No ptosis. CN V: Facial sensation is intact to pinprick in all 3 divisions bilaterally. Corneal responses are intact.  CN VII: Face is symmetric with normal eye closure and smile. CN VIII: Hearing is normal to rubbing fingers CN IX, X: Palate elevates symmetrically. Phonation is normal. CN XI: Head turning and shoulder shrug are intact CN XII: Tongue is midline with normal movements and no atrophy.  MOTOR: There is no pronator drift of out-stretched arms. Muscle bulk and tone are normal. Muscle strength is normal.   Shoulder abduction Shoulder external rotation Elbow flexion Elbow extension Wrist flexion Wrist extension Finger abduction Hip flexion Knee flexion Knee extension Ankle dorsi flexion Ankle plantar flexion  R 5 5 5 5 5 5 5 5 5 5 5 5   L 5 5 5 5 5 5 5 5 5 5 5 5     REFLEXES: Reflexes are 2+ and symmetric at the biceps, triceps, knees, and ankles. Plantar responses are flexor.  SENSORY: Light touch, pinprick, position sense, and vibration sense are intact in fingers and toes.  COORDINATION: Rapid alternating movements and fine finger movements are intact. There is no dysmetria on finger-to-nose and heel-knee-shin. There are no abnormal or extraneous movements.   GAIT/STANCE: Posture is normal. Gait is steady with normal steps, base, arm swing, and turning. Heel and toe walking are normal. Tandem gait is normal.  Romberg is absent.   DIAGNOSTIC DATA (LABS, IMAGING, TESTING) - I reviewed patient records, labs, notes, testing and imaging myself where available.  Lab Results  Component Value Date   WBC 5.5  05/25/2014   HGB 16.0 05/25/2014   HCT 47.0 05/25/2014   MCV 90.2 05/25/2014   PLT 197 05/25/2014      Component Value Date/Time   NA 138 05/25/2014 1149   K 3.9 05/25/2014 1149   CL 98 05/25/2014 1149   CO2 28 05/25/2014 1130   GLUCOSE 94 05/25/2014 1149   BUN 10 05/25/2014 1149   CREATININE 1.20 05/25/2014 1149   CALCIUM 9.3 05/25/2014 1130   PROT 6.9 05/25/2014 1130   ALBUMIN 4.3 05/25/2014 1130   AST 20 05/25/2014 1130   ALT 18 05/25/2014 1130   ALKPHOS 39 05/25/2014 1130  BILITOT 0.6 05/25/2014 1130   GFRNONAA 61* 05/25/2014 1130   GFRAA 71* 05/25/2014 1130   ASSESSMENT AND PLAN  GRAEDEN BITNER is a 59 y.o. male  presenting with transient left-sided numbness, slump over to the left side, family history of dementia, complains of high stress job mild difficulty focusing, short-term memory trouble  1, Differentiation diagnosis includes TIA, 2, his memory complaints can be related to his stress, anxiety, 3, proceed with MRI of the brain, laboratory evaluations 4, return to clinic in 3 months, I will call him result     Marcial Pacas, M.D. Ph.D.  [facility] [address] [place], [place] [address] Ph: [phone] Fax: [phone]

## 2014-06-14 LAB — VITAMIN B12: VITAMIN B 12: 593 pg/mL (ref 211–946)

## 2014-06-14 LAB — THYROID PANEL WITH TSH
FREE THYROXINE INDEX: 2.5 (ref 1.2–4.9)
T3 UPTAKE RATIO: 30 % (ref 24–39)
T4 TOTAL: 8.4 ug/dL (ref 4.5–12.0)
TSH: 3.16 u[IU]/mL (ref 0.450–4.500)

## 2014-06-14 LAB — FOLATE: Folate: 13.6 ng/mL (ref >3.0)

## 2014-06-23 ENCOUNTER — Ambulatory Visit
Admission: RE | Admit: 2014-06-23 | Discharge: 2014-06-23 | Disposition: A | Discharge status: Home / Self Care | Payer: Commercial Indemnity | Source: Ambulatory Visit | Attending: Neurology | Admitting: Neurology

## 2014-06-23 DIAGNOSIS — R202 Paresthesia of skin: Secondary | ICD-10-CM

## 2014-06-26 DIAGNOSIS — H33321 Round hole, right eye: Secondary | ICD-10-CM | POA: Insufficient documentation

## 2014-08-18 ENCOUNTER — Ambulatory Visit: Payer: BC Managed Care – PPO | Admitting: Pulmonary Disease

## 2014-08-18 ENCOUNTER — Encounter: Payer: Self-pay | Admitting: Internal Medicine

## 2014-08-18 ENCOUNTER — Ambulatory Visit (INDEPENDENT_AMBULATORY_CARE_PROVIDER_SITE_OTHER): Payer: Commercial Indemnity | Admitting: Internal Medicine

## 2014-08-18 VITALS — BP 100/68 | HR 65 | Ht 74.0 in | Wt 240.8 lb

## 2014-08-18 DIAGNOSIS — J302 Other seasonal allergic rhinitis: Secondary | ICD-10-CM | POA: Diagnosis not present

## 2014-08-18 DIAGNOSIS — G4733 Obstructive sleep apnea (adult) (pediatric): Secondary | ICD-10-CM | POA: Diagnosis not present

## 2014-08-18 NOTE — Progress Notes (Signed)
02/10/11- 54 yoM never smoker identified with OSA AHI 28.4 on a home unattended test done 11/09/10. Referred by Dr Janeice Robinson for our assessment. His wife has complained of loud snoring and witnessed apnea for years.  He admits poor sleep habits. Unable to on wind for sleep before midnight or 1 AM. Sleep latency 15 minutes. Often spontaneously awake once around 2 AM but otherwise sleeps through the night with nocturia x1 until up at 7:30. No caffeine before noon. Always fights daytime tiredness and has been drowsy driving which we discussed. He estimates he allergies 5-1/2-6 hours of sleep per night. On weekends he gets 8-10 hours. 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 No history of ENT surgery or cardiopulmonary disease. Family history of father with sleep apnea on CPAP.  04/07/11- 55 yoM never smoker identified with OSA AHI 28.4 on a home unattended test done 11/09/10 Very irregular sleep hours uses CPAP every night, estimating 7 hours per night. The initial pressure is too strong for comfort using auto titration. Nasal pillows mask. Anticipates rhinitis and spring weather and we discussed affect of nasal congestion.  08/04/11- 89 yoM never smoker identified with OSA AHI 28.4 on a home unattended test done 11/09/10 Doing well with fixed CPAP pressure 10/ [facility] and wears CPAP every night for approx 7 hours. He is seeing an allergist for seasonal nasal congestion, treated with Flonase and antihistamines. This is not interfering with his use of CPAP.  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 Yearly Follow up.  Wearing cpap 10/ [facility] every night for approx 7 hours.  No problems with mask or pressure at this time.  08/11/13- 15 yoM never smoker identified with OSA AHI 28.4 on a home unattended test done 11/09/10 Sees allergist Dr Harold Hedge  for SAR. FOLLOWS FOR: Wears CPAP 10/ [facility] every night for about 7 hours; pressure working well for patient. DME is [facility] and patient states he has not heard from them in about 3 years. Denies any troubles with allergies Was off CPAP for a while after right shoulder surgery - he couldn't lie down comfortably. Doing much better. Weight has been stable. CPAP compliance is general and with no reports of snoring. Nasal pillows mask. He has gotten supplies online with no contact from [facility].  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 FOLLOWS FOR: CPAP 10/ [facility] worn nightly 6-7 hrs. DME [facility]; pt not having any concerns. He reports doing very well and denies daytime sleepiness. He is comfortable with his CPAP and desires no changes.  ROS-see HPI Constitutional:   No-   weight loss, night sweats, fevers, chills, fatigue, lassitude. HEENT:   +  headaches, difficulty swallowing, tooth/dental problems, sore throat,       No-  sneezing, itching, ear ache, +seasonal nasal congestion, post nasal drip,  CV:  No-   chest pain, orthopnea, PND, swelling in lower extremities, anasarca, dizziness, palpitations Resp: No-   shortness of breath with exertion or at rest.              No-   productive cough,  No non-productive cough,  No- coughing up of blood.  No-   change in color of mucus.  No- wheezing.   Skin: No-   rash or lesions. GI:  No-   heartburn, indigestion, abdominal pain, nausea, vomiting,  GU: MS:  No-   joint pain or swelling.     Neuro-     nothing unusual Psych:  No- change in mood or affect. No depression or anxiety.  No memory loss.  OBJ- General- Alert, Oriented, Affect-appropriate, Distress- none acute, tall,             energetic, not obese Skin- rash-none, lesions- none, excoriation- none Lymphadenopathy- none Head- atraumatic            Eyes- Gross vision intact,  PERRLA, conjunctivae clear secretions            Ears- Hearing, canals-normal            Nose- Clear, no-Septal dev, mucus, polyps, erosion, perforation             Throat- Mallampati III , mucosa clear , drainage- none, tonsils- atrophic,  Neck- flexible , trachea midline, no stridor , thyroid nl, carotid no bruit Chest - symmetrical excursion , unlabored           Heart/CV- RRR , no murmur , no gallop  , no rub, nl s1 s2                           - JVD- none , edema- none, stasis changes- none, varices- none           Lung- clear to P&A, wheeze- none, cough- none , dullness-none, rub- none           Chest wall-  Abd-  Br/ Gen/ Rectal- Not done, not indicated Extrem- cyanosis- none, clubbing, none, atrophy- none, strength- nl Neuro- grossly intact to observation

## 2014-08-18 NOTE — Patient Instructions (Signed)
We can continue CPAP 10 [facility]  Order- DME [facility] download for documentation

## 2014-08-21 NOTE — Assessment & Plan Note (Signed)
Reports doing very well at current CPAP settings. We discussed comfort and goals. Plan-request download for documentation

## 2014-08-21 NOTE — Assessment & Plan Note (Signed)
Managed elsewhere. This is not getting in the way of his CPAP use at this time.

## 2014-09-14 ENCOUNTER — Ambulatory Visit: Payer: Commercial Indemnity | Admitting: Neurology

## 2014-09-18 ENCOUNTER — Encounter: Payer: Self-pay | Admitting: Internal Medicine

## 2014-09-20 ENCOUNTER — Ambulatory Visit: Payer: Commercial Indemnity | Admitting: Neurology

## 2014-10-17 ENCOUNTER — Ambulatory Visit: Payer: Commercial Indemnity | Admitting: Neurology

## 2014-11-14 DIAGNOSIS — H43811 Vitreous degeneration, right eye: Secondary | ICD-10-CM | POA: Insufficient documentation

## 2015-08-20 ENCOUNTER — Encounter: Payer: Self-pay | Admitting: Internal Medicine

## 2015-08-20 ENCOUNTER — Ambulatory Visit (INDEPENDENT_AMBULATORY_CARE_PROVIDER_SITE_OTHER): Payer: 59 | Admitting: Internal Medicine

## 2015-08-20 VITALS — BP 114/68 | HR 53 | Ht 74.0 in | Wt 239.2 lb

## 2015-08-20 DIAGNOSIS — G4733 Obstructive sleep apnea (adult) (pediatric): Secondary | ICD-10-CM

## 2015-08-20 NOTE — Patient Instructions (Signed)
Order- DME [facility]- continue CPAP 10, mask of choice, humidifier, supplies, AirView      Dx OSA                      He needs supplies including replacement for worn head strap please

## 2015-08-20 NOTE — Progress Notes (Signed)
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 Yearly Follow up.  Wearing cpap 10/ [facility] every night for approx 7 hours.  No problems with mask or pressure at this time.  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 FOLLOWS FOR: Wears CPAP 10/ [facility] every night for about 7 hours; pressure working well for patient. DME is [facility] and patient states he has not heard from them in about 3 years. Denies any troubles with allergies Was off CPAP for a while after right shoulder surgery - he couldn't lie down comfortably. Doing much better. Weight has been stable. CPAP compliance is general and with no reports of snoring. Nasal pillows mask. He has gotten supplies online with no contact from [facility].  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 FOLLOWS FOR: CPAP 10/ [facility] worn nightly 6-7 hrs. DME [facility]; pt not having any concerns. He reports doing very well and denies daytime sleepiness. He is comfortable with his CPAP and desires no changes.  08/20/2015-60 year old male never smoker followed for OSA, complicated by SAR (Dr. Harold Hedge allergist) CPAP 10/[facility] FOLLOWS FOR: DME: [facility]; Wears CPAP every night for about 6-8 hours. Pt will need new supplies today. DL attached. Incidental cold today but otherwise has done well. Normal sleep schedule if left to his own is bedtime 2 AM, up at 10 AM. Using nasal pillows mask. Download is reviewed and discussed-okay. Compliance with four-hour guide is 89% with AHI 2.0/hour.  ROS-see HPI Constitutional:   No-   weight loss, night sweats, fevers, chills, fatigue, lassitude. HEENT:   +  headaches, difficulty swallowing, tooth/dental problems, sore throat,        No-  sneezing, itching, ear ache, +seasonal nasal congestion, post nasal drip,  CV:  No-   chest pain, orthopnea, PND, swelling in lower extremities, anasarca, dizziness, palpitations Resp: No-   shortness of breath with exertion or at rest.              No-   productive cough,  No non-productive cough,  No- coughing up of blood.              No-   change in color of mucus.  No- wheezing.   Skin: No-   rash or lesions. GI:  No-   heartburn, indigestion, abdominal pain, nausea, vomiting,  GU: MS:  No-   joint pain or swelling.     Neuro-     nothing unusual Psych:  No- change in mood or affect. No depression or anxiety.  No memory loss.  OBJ- General- Alert, Oriented, Affect-appropriate, Distress- none acute, tall,  energetic, not obese Skin- rash-none, lesions- none, excoriation- none Lymphadenopathy- none Head- atraumatic            Eyes- Gross vision intact, PERRLA, conjunctivae clear secretions            Ears- Hearing, canals-normal            Nose- Clear, no-Septal dev, mucus, polyps, erosion, perforation             Throat- Mallampati III , mucosa clear , drainage- none, tonsils- atrophic,  Neck- flexible , trachea midline, no stridor , thyroid nl,  carotid no bruit Chest - symmetrical excursion , unlabored           Heart/CV- RRR , no murmur , no gallop  , no rub, nl s1 s2                           - JVD- none , edema- none, stasis changes- none, varices- none           Lung- clear to P&A, wheeze- none, cough- none , dullness-none, rub- none           Chest wall-  Abd-  Br/ Gen/ Rectal- Not done, not indicated Extrem- cyanosis- none, clubbing, none, atrophy- none, strength- nl Neuro- grossly intact to observation

## 2015-08-20 NOTE — Assessment & Plan Note (Signed)
Download is reviewed-satisfactory. We discussed goals, sleep habits and answered questions. We will send renewal order to his DME noting he needs replacement for worn out head straps.

## 2015-09-09 ENCOUNTER — Encounter (HOSPITAL_COMMUNITY): Payer: Self-pay | Admitting: Emergency Medicine

## 2015-09-09 ENCOUNTER — Ambulatory Visit (HOSPITAL_COMMUNITY)
Admission: EM | Admit: 2015-09-09 | Discharge: 2015-09-09 | Disposition: A | Discharge status: Home / Self Care | Payer: 59 | Attending: Emergency Medicine | Admitting: Emergency Medicine

## 2015-09-09 DIAGNOSIS — K429 Umbilical hernia without obstruction or gangrene: Secondary | ICD-10-CM | POA: Diagnosis not present

## 2015-09-09 NOTE — ED Triage Notes (Signed)
The patient presented to the [facility] with a complaint of mild abdominal pain that he attributes to a possible hernia that he felt while coughing today.

## 2015-09-09 NOTE — ED Provider Notes (Signed)
CSN: [id number]     Arrival date & time 09/09/15  1926 History   None    Chief Complaint  Patient presents with  . Abdominal Pain   (Consider location/radiation/quality/duration/timing/severity/associated sxs/prior Treatment)  HPI   The patient is a 60 year old male presenting today with abdominal pain. Patient states he has had bronchitis for which he was recently put on an antibiotic and has had intensive coughing. During one of his "coughing fits" today patient states he felt some discomfort in his navel and realized something was "poking out".  Denies other significant medical history. States allergy to sulfa.   Past Medical History:  Diagnosis Date  . Anxiety   . Chronic headaches   . Dizziness   . Environmental allergies   . High cholesterol   . Sleep apnea    Past Surgical History:  Procedure Laterality Date  . APPENDECTOMY    . knee/ankle surgeries     Family History  Problem Relation Age of Onset  . Allergies Son   . Allergies Sister   . Heart disease Father   . Stomach cancer Paternal Grandmother   . Rectal cancer Maternal Grandmother   . Diabetes Father   . Hypertension Father   . Stroke Father   . Alzheimer's disease Mother    Social History  Substance Use Topics  . Smoking status: Never Smoker  . Smokeless tobacco: Not on file  . Alcohol use 1.2 oz/week    2 Standard drinks or equivalent per week     Comment: 4 drinks per month    Review of Systems  Constitutional: Negative.   HENT: Negative.   Eyes: Negative.   Respiratory: Negative.   Cardiovascular: Negative.   Gastrointestinal: Positive for abdominal pain. Negative for diarrhea, nausea and vomiting.  Endocrine: Negative.   Genitourinary: Negative.   Musculoskeletal: Negative.   Skin: Negative.  Negative for color change.  Allergic/Immunologic: Negative.   Neurological: Negative.   Hematological: Negative.   Psychiatric/Behavioral: Negative.     Allergies  Sulfa antibiotics  Home  Medications   Prior to Admission medications   Medication Sig Start Date End Date Taking? Authorizing Provider  aspirin 81 MG tablet Take 81 mg by mouth daily.    Yes Historical Provider, MD  azithromycin (ZITHROMAX) 250 MG tablet Take 250 mg by mouth daily.   Yes Historical Provider, MD  fluticasone (FLONASE) 50 MCG/ACT nasal spray Place 1 spray into both nostrils Daily.  03/14/11  Yes Historical Provider, MD  lovastatin (MEVACOR) 20 MG tablet Take 1 tablet by mouth daily. 12/22/10  Yes Historical Provider, MD  ALPRAZolam Duanne Moron) 0.5 MG tablet  04/24/14   Historical Provider, MD  aspirin EC 81 MG tablet Take 81 mg by mouth daily.    Historical Provider, MD  fexofenadine (ALLEGRA) 180 MG tablet Take 180 mg by mouth daily.    Historical Provider, MD  Multiple Vitamin (MULTIVITAMIN) tablet Take 1 tablet by mouth daily.    Historical Provider, MD  SUMAtriptan (IMITREX) 50 MG tablet Take 50 mg by mouth as needed for migraine. May repeat in 2 hours if headache persists or recurs.    Historical Provider, MD  triamcinolone ointment (KENALOG) 0.1 % Apply 0.1 g topically 2 (two) times daily as needed. 06/17/14   Historical Provider, MD  valACYclovir (VALTREX) 1000 MG tablet Take 1 g by mouth as needed. 07/14/14   Historical Provider, MD   Meds Ordered and Administered this Visit  Medications - No data to display  BP 112/66 (BP  Location: Right Arm)   Pulse 64   Temp 97.8 F (36.6 C) (Oral)   Resp 18   SpO2 98%  No data found.   Physical Exam  Constitutional: He appears well-developed and well-nourished. No distress.  Cardiovascular: Normal rate, regular rhythm, normal heart sounds and intact distal pulses.  Exam reveals no gallop and no friction rub.   No murmur heard. Pulmonary/Chest: Effort normal and breath sounds normal. No respiratory distress. He has no wheezes. He has no rales. He exhibits no tenderness.  Abdominal: Soft. Bowel sounds are normal. He exhibits no distension and no mass. There is  no tenderness. There is no rebound and no guarding. A hernia is present.  Easily reducible small umbilical hernia noted at approximately 2:00.    Skin: Skin is warm and dry. He is not diaphoretic.  Nursing note and vitals reviewed.   Urgent Care Course   Clinical Course    Procedures (including critical care time)  Labs Review Labs Reviewed - No data to display  Imaging Review No results found.   MDM   1. Umbilical hernia without obstruction and without gangrene     Discussed s/s of strangulated hernia and options for repair.  Referral made for repair.  Advised patient no weight lifting at this time.     Nehemiah Settle, NP 09/09/15 2124

## 2015-09-13 ENCOUNTER — Ambulatory Visit: Payer: Self-pay | Admitting: General Surgery

## 2015-09-13 ENCOUNTER — Encounter (HOSPITAL_COMMUNITY): Payer: Self-pay | Admitting: *Deleted

## 2015-09-13 MED ORDER — CEFAZOLIN SODIUM-DEXTROSE 2-4 GM/100ML-% IV SOLN
2.0000 g | INTRAVENOUS | Status: AC
  Administered 2015-09-14: 2 g via INTRAVENOUS
  Filled 2015-09-13: qty 100

## 2015-09-13 NOTE — H&P (Signed)
History of Present Illness Ralene Ok MD; 09/13/2015 10:23 AM) Patient words: umb hernia.  The patient is a 60 year old male who presents with an umbilical hernia. Patient is a 60 year old male is referred by Dr. Maryruth Eve for evaluation of an umbilical hernia. Patient states that several weeks ago he was diagnosed with bronchitis. He states that coughing states that he noticed some umbilical pain. He states at that time he felt a bulge in that area. He states that thereafter he's noticed some on and off burning, discomfort and area. Patient was seen in urgent clinic was diagnosed with umbilical hernia. He is referred here for further evaluation and treatment.  Of note the patient's had a previous open appendectomy in the 2s. Patient is an avid skier.   Other Problems Ventura Sellers, CMA; 09/13/2015 10:12 AM) Gastroesophageal Reflux Disease Hemorrhoids Hypercholesterolemia Migraine Headache Sleep Apnea Umbilical Hernia Repair  Past Surgical History Ventura Sellers, CMA; 09/13/2015 10:12 AM) Appendectomy Cataract Surgery Bilateral. Colon Polyp Removal - Colonoscopy Foot Surgery Bilateral. Knee Surgery Bilateral. Shoulder Surgery Right.  Diagnostic Studies History Ventura Sellers, [place]; 09/13/2015 10:12 AM) Colonoscopy 1-5 years ago  Allergies Ventura Sellers, [place]; 09/13/2015 10:12 AM) Sulfa 10 *OPHTHALMIC AGENTS*  Medication History Ventura Sellers, CMA; 09/13/2015 10:13 AM) Fluticasone Propionate (50MCG/ACT Suspension, Nasal) Active. Lovastatin (20MG  Tablet, Oral) Active. Allegra (180MG  Tablet, Oral) Active. Aspirin (81MG  Tablet, Oral) Active.  Social History Ventura Sellers, [place]; 09/13/2015 10:12 AM) Alcohol use Occasional alcohol use. Caffeine use Carbonated beverages, Coffee, Tea. No drug use Tobacco use Never smoker.  Family History Ventura Sellers, [place]; 09/13/2015 10:12 AM) Arthritis Mother. Cerebrovascular Accident  Father. Depression Mother, Sister. Diabetes Mellitus Father. Heart Disease Father. Heart disease in male family member before age 59 Hypertension Father. Migraine Headache Mother, Sister.    Review of Systems ([place]. Brooks CMA; 09/13/2015 10:12 AM) General Not Present- Appetite Loss, Chills, Fatigue, Fever, Night Sweats, Weight Gain and Weight Loss. Skin Not Present- Change in Wart/Mole, Dryness, Hives, Jaundice, New Lesions, Non-Healing Wounds, Rash and Ulcer. HEENT Present- Seasonal Allergies and Wears glasses/contact lenses. Not Present- Earache, Hearing Loss, Hoarseness, Nose Bleed, Oral Ulcers, Ringing in the Ears, Sinus Pain, Sore Throat, Visual Disturbances and Yellow Eyes. Respiratory Present- Snoring. Not Present- Bloody sputum, Chronic Cough, Difficulty Breathing and Wheezing. Breast Not Present- Breast Mass, Breast Pain, Nipple Discharge and Skin Changes. Cardiovascular Not Present- Chest Pain, Difficulty Breathing Lying Down, Leg Cramps, Palpitations, Rapid Heart Rate, Shortness of Breath and Swelling of Extremities. Gastrointestinal Present- Abdominal Pain. Not Present- Bloating, Bloody Stool, Change in Bowel Habits, Chronic diarrhea, Constipation, Difficulty Swallowing, Excessive gas, Gets full quickly at meals, Hemorrhoids, Indigestion, Nausea, Rectal Pain and Vomiting. Male Genitourinary Not Present- Blood in Urine, Change in Urinary Stream, Frequency, Impotence, Nocturia, Painful Urination, Urgency and Urine Leakage. Musculoskeletal Not Present- Back Pain, Joint Pain, Joint Stiffness, Muscle Pain, Muscle Weakness and Swelling of Extremities. Neurological Not Present- Decreased Memory, Fainting, Headaches, Numbness, Seizures, Tingling, Tremor, Trouble walking and Weakness. Psychiatric Not Present- Anxiety, Bipolar, Change in Sleep Pattern, Depression, Fearful and Frequent crying. Endocrine Not Present- Cold Intolerance, Excessive Hunger, Hair Changes, Heat Intolerance,  Hot flashes and New Diabetes. Hematology Not Present- Blood Thinners, Easy Bruising, Excessive bleeding, Gland problems, HIV and Persistent Infections.  Vitals [employer] R. Brooks CMA; 09/13/2015 10:12 AM) 09/13/2015 10:12 AM Weight: 235.5 lb Height: 74in Body Surface Area: 2.33 m Body Mass Index: 30.24 kg/m  Pulse: 68 (Regular)  BP: 120/76 (Sitting, Left Arm,  Standard)       Physical Exam Ralene Ok MD; 09/13/2015 10:24 AM) General Mental Status-Alert. General Appearance-Consistent with stated age. Hydration-Well hydrated. Voice-Normal.  Head and Neck Head-normocephalic, atraumatic with no lesions or palpable masses. Trachea-midline. Thyroid Gland Characteristics - normal size and consistency.  Chest and Lung Exam Chest and lung exam reveals -quiet, even and easy respiratory effort with no use of accessory muscles and on auscultation, normal breath sounds, no adventitious sounds and normal vocal resonance. Inspection Chest Wall - Normal. Back - normal.  Cardiovascular Cardiovascular examination reveals -normal heart sounds, regular rate and rhythm with no murmurs and normal pedal pulses bilaterally.  Abdomen Inspection Skin - Scar - no surgical scars. Hernias - Umbilical hernia - Reducible. Note: Small approximately 1 cm umbilical hernia. Palpation/Percussion Normal exam - Soft, Non Tender, No Rebound tenderness, No Rigidity (guarding) and No hepatosplenomegaly. Auscultation Normal exam - Bowel sounds normal.    Assessment & Plan Ralene Ok MD; [id number] AM) UMBILICAL HERNIA WITHOUT OBSTRUCTION AND WITHOUT GANGRENE (K42.9) Impression: Patient is a 60 year old male with a reducible umbilical hernia.  1. The patient will like to proceed to the operating room for laparoscopic umbilical hernia repair with mesh.  2. I discussed with the patient the signs and symptoms of incarceration and strangulation and the need to proceed to the  ER should they occur.  3. I discussed with the patient the risks and benefits of the procedure to include but not limited to: Infection, bleeding, damage to surrounding structures, possible need for further surgery, possible nerve pain, and possible recurrence. The patient was understanding and wishes to proceed.

## 2015-09-14 ENCOUNTER — Ambulatory Visit (HOSPITAL_COMMUNITY): Payer: 59 | Admitting: Certified Registered Nurse Anesthetist

## 2015-09-14 ENCOUNTER — Ambulatory Visit (HOSPITAL_COMMUNITY)
Admission: RE | Admit: 2015-09-14 | Discharge: 2015-09-14 | Disposition: A | Discharge status: Home / Self Care | Payer: 59 | Source: Ambulatory Visit | Attending: General Surgery | Admitting: General Surgery

## 2015-09-14 ENCOUNTER — Encounter (HOSPITAL_COMMUNITY): Payer: Self-pay | Admitting: *Deleted

## 2015-09-14 ENCOUNTER — Encounter (HOSPITAL_COMMUNITY)
Admission: RE | Disposition: A | Discharge status: Home / Self Care | Payer: Self-pay | Source: Ambulatory Visit | Attending: General Surgery

## 2015-09-14 DIAGNOSIS — K429 Umbilical hernia without obstruction or gangrene: Secondary | ICD-10-CM | POA: Diagnosis not present

## 2015-09-14 DIAGNOSIS — G43909 Migraine, unspecified, not intractable, without status migrainosus: Secondary | ICD-10-CM | POA: Diagnosis not present

## 2015-09-14 DIAGNOSIS — Z79899 Other long term (current) drug therapy: Secondary | ICD-10-CM | POA: Diagnosis not present

## 2015-09-14 DIAGNOSIS — Z7982 Long term (current) use of aspirin: Secondary | ICD-10-CM | POA: Insufficient documentation

## 2015-09-14 DIAGNOSIS — Z7951 Long term (current) use of inhaled steroids: Secondary | ICD-10-CM | POA: Diagnosis not present

## 2015-09-14 DIAGNOSIS — G4730 Sleep apnea, unspecified: Secondary | ICD-10-CM | POA: Insufficient documentation

## 2015-09-14 DIAGNOSIS — E7800 Pure hypercholesterolemia, unspecified: Secondary | ICD-10-CM | POA: Diagnosis not present

## 2015-09-14 DIAGNOSIS — F419 Anxiety disorder, unspecified: Secondary | ICD-10-CM | POA: Insufficient documentation

## 2015-09-14 DIAGNOSIS — E785 Hyperlipidemia, unspecified: Secondary | ICD-10-CM | POA: Insufficient documentation

## 2015-09-14 HISTORY — PX: UMBILICAL HERNIA REPAIR: SHX196

## 2015-09-14 HISTORY — DX: Pneumonia, unspecified organism: J18.9

## 2015-09-14 HISTORY — DX: Gastro-esophageal reflux disease without esophagitis: K21.9

## 2015-09-14 HISTORY — PX: INSERTION OF MESH: SHX5868

## 2015-09-14 HISTORY — DX: Personal history of other infectious and parasitic diseases: Z86.19

## 2015-09-14 HISTORY — DX: Unspecified osteoarthritis, unspecified site: M19.90

## 2015-09-14 LAB — CBC
HCT: 45.2 % (ref 39.0–52.0)
Hemoglobin: 15.2 g/dL (ref 13.0–17.0)
MCH: 30.8 pg (ref 26.0–34.0)
MCHC: 33.6 g/dL (ref 30.0–36.0)
MCV: 91.5 fL (ref 78.0–100.0)
Platelets: 205 10*3/uL (ref 150–400)
RBC: 4.94 MIL/uL (ref 4.22–5.81)
RDW: 12.5 % (ref 11.5–15.5)
WBC: 4.9 10*3/uL (ref 4.0–10.5)

## 2015-09-14 SURGERY — REPAIR, HERNIA, UMBILICAL, LAPAROSCOPIC
Anesthesia: General | Site: Abdomen

## 2015-09-14 MED ORDER — PHENYLEPHRINE 40 MCG/ML (10ML) SYRINGE FOR IV PUSH (FOR BLOOD PRESSURE SUPPORT)
PREFILLED_SYRINGE | INTRAVENOUS | Status: AC
  Filled 2015-09-14: qty 30

## 2015-09-14 MED ORDER — FENTANYL CITRATE (PF) 250 MCG/5ML IJ SOLN
INTRAMUSCULAR | Status: AC
  Filled 2015-09-14: qty 5

## 2015-09-14 MED ORDER — CHLORHEXIDINE GLUCONATE CLOTH 2 % EX PADS: 6.0000 | MEDICATED_PAD | Freq: Once | CUTANEOUS | Status: DC

## 2015-09-14 MED ORDER — FENTANYL CITRATE (PF) 100 MCG/2ML IJ SOLN
25.0000 ug | INTRAMUSCULAR | Status: DC | PRN
  Administered 2015-09-14 (×2): 50 ug via INTRAVENOUS

## 2015-09-14 MED ORDER — ONDANSETRON HCL 4 MG/2ML IJ SOLN
INTRAMUSCULAR | Status: AC
  Filled 2015-09-14: qty 2

## 2015-09-14 MED ORDER — BUPIVACAINE HCL 0.25 % IJ SOLN
INTRAMUSCULAR | Status: DC | PRN
  Administered 2015-09-14: 5 mL

## 2015-09-14 MED ORDER — NEOSTIGMINE METHYLSULFATE 10 MG/10ML IV SOLN
INTRAVENOUS | Status: DC | PRN
  Administered 2015-09-14: 4 mg via INTRAVENOUS

## 2015-09-14 MED ORDER — PROMETHAZINE HCL 25 MG/ML IJ SOLN: 12.5000 mg | Freq: Once | INTRAMUSCULAR | Status: DC | PRN

## 2015-09-14 MED ORDER — ROCURONIUM BROMIDE 100 MG/10ML IV SOLN
INTRAVENOUS | Status: DC | PRN
  Administered 2015-09-14: 50 mg via INTRAVENOUS

## 2015-09-14 MED ORDER — LIDOCAINE HCL (CARDIAC) 20 MG/ML IV SOLN
INTRAVENOUS | Status: DC | PRN
  Administered 2015-09-14: 100 mg via INTRAVENOUS

## 2015-09-14 MED ORDER — FENTANYL CITRATE (PF) 100 MCG/2ML IJ SOLN
INTRAMUSCULAR | Status: DC | PRN
  Administered 2015-09-14 (×2): 50 ug via INTRAVENOUS
  Administered 2015-09-14: 100 ug via INTRAVENOUS
  Administered 2015-09-14: 50 ug via INTRAVENOUS

## 2015-09-14 MED ORDER — GLYCOPYRROLATE 0.2 MG/ML IJ SOLN
INTRAMUSCULAR | Status: DC | PRN
  Administered 2015-09-14: 0.6 mg via INTRAVENOUS

## 2015-09-14 MED ORDER — LACTATED RINGERS IV SOLN
INTRAVENOUS | Status: DC
  Administered 2015-09-14 (×2): via INTRAVENOUS

## 2015-09-14 MED ORDER — PROPOFOL 10 MG/ML IV BOLUS
INTRAVENOUS | Status: AC
  Filled 2015-09-14: qty 20

## 2015-09-14 MED ORDER — FENTANYL CITRATE (PF) 100 MCG/2ML IJ SOLN
INTRAMUSCULAR | Status: AC
  Filled 2015-09-14: qty 2

## 2015-09-14 MED ORDER — ROCURONIUM BROMIDE 50 MG/5ML IV SOLN
INTRAVENOUS | Status: AC
  Filled 2015-09-14: qty 1

## 2015-09-14 MED ORDER — MIDAZOLAM HCL 2 MG/2ML IJ SOLN
INTRAMUSCULAR | Status: AC
  Filled 2015-09-14: qty 2

## 2015-09-14 MED ORDER — ONDANSETRON HCL 4 MG/2ML IJ SOLN
INTRAMUSCULAR | Status: DC | PRN
  Administered 2015-09-14: 4 mg via INTRAVENOUS

## 2015-09-14 MED ORDER — OXYCODONE-ACETAMINOPHEN 5-325 MG PO TABS: 1.0000 | ORAL_TABLET | ORAL | 0 refills | Status: DC | PRN

## 2015-09-14 MED ORDER — EPHEDRINE SULFATE 50 MG/ML IJ SOLN
INTRAMUSCULAR | Status: DC | PRN
  Administered 2015-09-14: 5 mg via INTRAVENOUS

## 2015-09-14 MED ORDER — PROPOFOL 10 MG/ML IV BOLUS
INTRAVENOUS | Status: DC | PRN
  Administered 2015-09-14: 180 mg via INTRAVENOUS

## 2015-09-14 MED ORDER — EPHEDRINE 5 MG/ML INJ
INTRAVENOUS | Status: AC
  Filled 2015-09-14: qty 10

## 2015-09-14 MED ORDER — BUPIVACAINE HCL (PF) 0.25 % IJ SOLN
INTRAMUSCULAR | Status: AC
  Filled 2015-09-14: qty 30

## 2015-09-14 MED ORDER — SUCCINYLCHOLINE CHLORIDE 200 MG/10ML IV SOSY
PREFILLED_SYRINGE | INTRAVENOUS | Status: AC
  Filled 2015-09-14: qty 20

## 2015-09-14 MED ORDER — 0.9 % SODIUM CHLORIDE (POUR BTL) OPTIME
TOPICAL | Status: DC | PRN
  Administered 2015-09-14: 1000 mL

## 2015-09-14 SURGICAL SUPPLY — 53 items
APL SKNCLS STERI-STRIP NONHPOA (GAUZE/BANDAGES/DRESSINGS) ×1
APPLIER CLIP LOGIC TI 5 (MISCELLANEOUS) IMPLANT
APPLIER CLIP ROT 10 11.4 M/L (STAPLE)
APR CLP MED LRG 11.4X10 (STAPLE)
APR CLP MED LRG 33X5 (MISCELLANEOUS)
BENZOIN TINCTURE PRP APPL 2/3 (GAUZE/BANDAGES/DRESSINGS) ×3 IMPLANT
BLADE SURG ROTATE 9660 (MISCELLANEOUS) IMPLANT
CANISTER SUCTION 2500CC (MISCELLANEOUS) IMPLANT
CHLORAPREP W/TINT 26ML (MISCELLANEOUS) ×3 IMPLANT
CLIP APPLIE ROT 10 11.4 M/L (STAPLE) IMPLANT
CLOSURE WOUND 1/2 X4 (GAUZE/BANDAGES/DRESSINGS) ×1
COVER SURGICAL LIGHT HANDLE (MISCELLANEOUS) ×3 IMPLANT
DEVICE SECURE STRAP 25 ABSORB (INSTRUMENTS) ×3 IMPLANT
DEVICE TROCAR PUNCTURE CLOSURE (ENDOMECHANICALS) ×3 IMPLANT
ELECT REM PT RETURN 9FT ADLT (ELECTROSURGICAL) ×3
ELECTRODE REM PT RTRN 9FT ADLT (ELECTROSURGICAL) ×1 IMPLANT
GAUZE SPONGE 2X2 8PLY STRL LF (GAUZE/BANDAGES/DRESSINGS) ×1 IMPLANT
GLOVE BIO SURGEON STRL SZ 6.5 (GLOVE) ×1 IMPLANT
GLOVE BIO SURGEON STRL SZ7 (GLOVE) ×2 IMPLANT
GLOVE BIO SURGEON STRL SZ7.5 (GLOVE) ×3 IMPLANT
GLOVE BIO SURGEONS STRL SZ 6.5 (GLOVE) ×1
GLOVE BIOGEL PI IND STRL 7.0 (GLOVE) IMPLANT
GLOVE BIOGEL PI INDICATOR 7.0 (GLOVE) ×4
GOWN STRL REUS W/ TWL LRG LVL3 (GOWN DISPOSABLE) ×2 IMPLANT
GOWN STRL REUS W/ TWL XL LVL3 (GOWN DISPOSABLE) ×1 IMPLANT
GOWN STRL REUS W/TWL LRG LVL3 (GOWN DISPOSABLE) ×6
GOWN STRL REUS W/TWL XL LVL3 (GOWN DISPOSABLE) ×3
KIT BASIN OR (CUSTOM PROCEDURE TRAY) ×3 IMPLANT
KIT ROOM TURNOVER OR (KITS) ×3 IMPLANT
MARKER SKIN DUAL TIP RULER LAB (MISCELLANEOUS) ×3 IMPLANT
MESH VENTRALIGHT ST 4.5IN (Mesh General) ×2 IMPLANT
NDL INSUFFLATION 14GA 120MM (NEEDLE) ×1 IMPLANT
NDL SPNL 22GX3.5 QUINCKE BK (NEEDLE) IMPLANT
NEEDLE INSUFFLATION 14GA 120MM (NEEDLE) ×3 IMPLANT
NEEDLE SPNL 22GX3.5 QUINCKE BK (NEEDLE) IMPLANT
NS IRRIG 1000ML POUR BTL (IV SOLUTION) ×3 IMPLANT
PAD ARMBOARD 7.5X6 YLW CONV (MISCELLANEOUS) ×6 IMPLANT
SCISSORS LAP 5X35 DISP (ENDOMECHANICALS) ×3 IMPLANT
SET IRRIG TUBING LAPAROSCOPIC (IRRIGATION / IRRIGATOR) IMPLANT
SLEEVE ENDOPATH XCEL 5M (ENDOMECHANICALS) ×3 IMPLANT
SPONGE GAUZE 2X2 STER 10/PKG (GAUZE/BANDAGES/DRESSINGS) ×2
STRIP CLOSURE SKIN 1/2X4 (GAUZE/BANDAGES/DRESSINGS) ×2 IMPLANT
SUT CHROMIC 2 0 SH (SUTURE) ×3 IMPLANT
SUT MNCRL AB 4-0 PS2 18 (SUTURE) ×3 IMPLANT
SUT NOVA 1 T20/GS 25DT (SUTURE) IMPLANT
SUT PROLENE 2 0 KS (SUTURE) IMPLANT
TOWEL OR 17X24 6PK STRL BLUE (TOWEL DISPOSABLE) ×3 IMPLANT
TOWEL OR 17X26 10 PK STRL BLUE (TOWEL DISPOSABLE) ×3 IMPLANT
TRAY LAPAROSCOPIC MC (CUSTOM PROCEDURE TRAY) ×3 IMPLANT
TROCAR XCEL BLUNT TIP 100MML (ENDOMECHANICALS) IMPLANT
TROCAR XCEL NON-BLD 11X100MML (ENDOMECHANICALS) IMPLANT
TROCAR XCEL NON-BLD 5MMX100MML (ENDOMECHANICALS) ×3 IMPLANT
TUBING INSUFFLATION (TUBING) ×3 IMPLANT

## 2015-09-14 NOTE — Anesthesia Procedure Notes (Addendum)
Procedure Name: Intubation Date/Time: 09/14/2015 12:59 PM Performed by: Shirlyn Goltz Pre-anesthesia Checklist: Patient identified, Emergency Drugs available, Suction available and Patient being monitored Patient Re-evaluated:Patient Re-evaluated prior to inductionOxygen Delivery Method: Circle system utilized Preoxygenation: Pre-oxygenation with 100% oxygen Intubation Type: IV induction Ventilation: Mask ventilation without difficulty and Oral airway inserted - appropriate to patient size Laryngoscope Size: Mac and 3 Grade View: Grade I Tube type: Oral Tube size: 7.5 mm Number of attempts: 1 Airway Equipment and Method: Stylet and LTA kit utilized Placement Confirmation: ETT inserted through vocal cords under direct vision,  positive ETCO2 and breath sounds checked- equal and bilateral Secured at: 21 cm Tube secured with: Tape Dental Injury: Teeth and Oropharynx as per pre-operative assessment

## 2015-09-14 NOTE — Interval H&P Note (Signed)
History and Physical Interval Note:  09/14/2015 12:12 PM  Corey Hensley  has presented today for surgery, with the diagnosis of umbilical hernia  The various methods of treatment have been discussed with the patient and family. After consideration of risks, benefits and other options for treatment, the patient has consented to  Procedure(s): LAPAROSCOPIC UMBILICAL HERNIA (N/A) INSERTION OF MESH (N/A) as a surgical intervention .  The patient's history has been reviewed, patient examined, no change in status, stable for surgery.  I have reviewed the patient's chart and labs.  Questions were answered to the patient's satisfaction.     Rosario Jacks., Anne Hahn

## 2015-09-14 NOTE — Anesthesia Postprocedure Evaluation (Signed)
Anesthesia Post Note  Patient: Corey Hensley  Procedure(s) Performed: Procedure(s) (LRB): LAPAROSCOPIC UMBILICAL HERNIA (N/A) INSERTION OF MESH (N/A)  Patient location during evaluation: PACU Anesthesia Type: General Level of consciousness: awake and alert Pain management: pain level controlled Vital Signs Assessment: post-procedure vital signs reviewed and stable Respiratory status: spontaneous breathing, nonlabored ventilation and respiratory function stable Cardiovascular status: blood pressure returned to baseline and stable Postop Assessment: no signs of nausea or vomiting Anesthetic complications: no    Last Vitals:  Vitals:   09/14/15 1415 09/14/15 1430  BP: 128/76 130/82  Pulse: (!) 43 (!) 50  Resp: (!) 8 15  Temp:      Last Pain:  Vitals:   09/14/15 1433  TempSrc:   PainSc: 7                  Nilda Simmer

## 2015-09-14 NOTE — Op Note (Signed)
09/14/2015  1:39 PM  PATIENT:  Corey Hensley  60 y.o. male  PRE-OPERATIVE DIAGNOSIS:  UMBILIICAL HERNIA  POST-OPERATIVE DIAGNOSIS:  UMBILIICAL HERNIA  PROCEDURE:  Procedure(s): LAPAROSCOPIC UMBILICAL HERNIA (N/A) INSERTION OF MESH (N/A)  SURGEON:  Surgeon(s) and Role:    * Ralene Ok, MD - Primary  ANESTHESIA:   local and general  EBL: 5cc  BLOOD ADMINISTERED:none  DRAINS: none   LOCAL MEDICATIONS USED:  BUPIVICAINE   SPECIMEN:  No Specimen  DISPOSITION OF SPECIMEN:  N/A  COUNTS:  YES  TOURNIQUET:  * No tourniquets in log *  DICTATION: .Dragon Dictation  Details of the procedure:   After the patient was consented patient was taken back to the operating room patient was then placed in supine position bilateral SCDs in place.  The patient was prepped and draped in the usual sterile fashion. After antibiotics were confirmed a timeout was called and all facts were verified. The Veress needle technique was used to insuflate the abdomen at Palmer's point. The abdomen was insufflated to 14 mm mercury. Subsequently a 5 mm trocar was placed a camera inserted there was no injury to any intra-abdominal organs.    There was seen to be an unincarcerated 1cm umbilical hernia.  A second camera port was in placed into the left lower quadrant.  Preperitoneal fat was taken down from the anterior abdominal wall.  I proceeded to reduce the hernia contents.  Once the hernia was cleared away, a Bard Ventralight 11.4cm  mesh was inserted into the abdomen.  The mesh was secured circumferentially with am Securestrap tacker in a double crown fashion.   The omentum was brought over the area of the mesh. The pneumoperitoneum was evacuated  & all trocars  were removed. The skin was reapproximated with 4-0  Monocryl sutures in a subcuticular fashion. The skin was dressed with Steri-Strips tape and gauze.  The patient was taken to the recovery room in stable condition.   PLAN OF CARE: Discharge  to home after PACU  PATIENT DISPOSITION:  PACU - hemodynamically stable.   Delay start of Pharmacological VTE agent (>24hrs) due to surgical blood loss or risk of bleeding: not applicable

## 2015-09-14 NOTE — Transfer of Care (Signed)
Immediate Anesthesia Transfer of Care Note  Patient: Corey Hensley  Procedure(s) Performed: Procedure(s): LAPAROSCOPIC UMBILICAL HERNIA (N/A) INSERTION OF MESH (N/A)  Patient Location: PACU  Anesthesia Type:General  Level of Consciousness: awake, alert , oriented and patient cooperative  Airway & Oxygen Therapy: Patient Spontanous Breathing and Patient connected to nasal cannula oxygen  Post-op Assessment: Report given to RN and Post -op Vital signs reviewed and stable  Post vital signs: Reviewed and stable  Last Vitals:  Vitals:   09/14/15 1129 09/14/15 1400  BP: 119/64   Pulse: (!) 49   Resp: 20   Temp: 36.7 C (P) 36.4 C    Last Pain:  Vitals:   09/14/15 1129  TempSrc: Oral         Complications: No apparent anesthesia complications

## 2015-09-14 NOTE — H&P (View-Only) (Signed)
History of Present Illness Corey Hensley; 09/13/2015 10:23 AM) Patient words: umb hernia.  The patient is a 60 year old male who presents with an umbilical hernia. Patient is a 60 year old male is referred by Dr. Maryruth Eve for evaluation of an umbilical hernia. Patient states that several weeks ago he was diagnosed with bronchitis. He states that coughing states that he noticed some umbilical pain. He states at that time he felt a bulge in that area. He states that thereafter he's noticed some on and off burning, discomfort and area. Patient was seen in urgent clinic was diagnosed with umbilical hernia. He is referred here for further evaluation and treatment.  Of note the patient's had a previous open appendectomy in the 74s. Patient is an avid skier.   Other Problems Ventura Sellers, CMA; 09/13/2015 10:12 AM) Gastroesophageal Reflux Disease Hemorrhoids Hypercholesterolemia Migraine Headache Sleep Apnea Umbilical Hernia Repair  Past Surgical History Ventura Sellers, CMA; 09/13/2015 10:12 AM) Appendectomy Cataract Surgery Bilateral. Colon Polyp Removal - Colonoscopy Foot Surgery Bilateral. Knee Surgery Bilateral. Shoulder Surgery Right.  Diagnostic Studies History Ventura Sellers, [place]; 09/13/2015 10:12 AM) Colonoscopy 1-5 years ago  Allergies Ventura Sellers, [place]; 09/13/2015 10:12 AM) Sulfa 10 *OPHTHALMIC AGENTS*  Medication History Ventura Sellers, CMA; 09/13/2015 10:13 AM) Fluticasone Propionate (50MCG/ACT Suspension, Nasal) Active. Lovastatin (20MG  Tablet, Oral) Active. Allegra (180MG  Tablet, Oral) Active. Aspirin (81MG  Tablet, Oral) Active.  Social History Ventura Sellers, [place]; 09/13/2015 10:12 AM) Alcohol use Occasional alcohol use. Caffeine use Carbonated beverages, Coffee, Tea. No drug use Tobacco use Never smoker.  Family History Ventura Sellers, [place]; 09/13/2015 10:12 AM) Arthritis Mother. Cerebrovascular Accident  Father. Depression Mother, Sister. Diabetes Mellitus Father. Heart Disease Father. Heart disease in male family member before age 67 Hypertension Father. Migraine Headache Mother, Sister.    Review of Systems ([place]. Brooks CMA; 09/13/2015 10:12 AM) General Not Present- Appetite Loss, Chills, Fatigue, Fever, Night Sweats, Weight Gain and Weight Loss. Skin Not Present- Change in Wart/Mole, Dryness, Hives, Jaundice, New Lesions, Non-Healing Wounds, Rash and Ulcer. HEENT Present- Seasonal Allergies and Wears glasses/contact lenses. Not Present- Earache, Hearing Loss, Hoarseness, Nose Bleed, Oral Ulcers, Ringing in the Ears, Sinus Pain, Sore Throat, Visual Disturbances and Yellow Eyes. Respiratory Present- Snoring. Not Present- Bloody sputum, Chronic Cough, Difficulty Breathing and Wheezing. Breast Not Present- Breast Mass, Breast Pain, Nipple Discharge and Skin Changes. Cardiovascular Not Present- Chest Pain, Difficulty Breathing Lying Down, Leg Cramps, Palpitations, Rapid Heart Rate, Shortness of Breath and Swelling of Extremities. Gastrointestinal Present- Abdominal Pain. Not Present- Bloating, Bloody Stool, Change in Bowel Habits, Chronic diarrhea, Constipation, Difficulty Swallowing, Excessive gas, Gets full quickly at meals, Hemorrhoids, Indigestion, Nausea, Rectal Pain and Vomiting. Male Genitourinary Not Present- Blood in Urine, Change in Urinary Stream, Frequency, Impotence, Nocturia, Painful Urination, Urgency and Urine Leakage. Musculoskeletal Not Present- Back Pain, Joint Pain, Joint Stiffness, Muscle Pain, Muscle Weakness and Swelling of Extremities. Neurological Not Present- Decreased Memory, Fainting, Headaches, Numbness, Seizures, Tingling, Tremor, Trouble walking and Weakness. Psychiatric Not Present- Anxiety, Bipolar, Change in Sleep Pattern, Depression, Fearful and Frequent crying. Endocrine Not Present- Cold Intolerance, Excessive Hunger, Hair Changes, Heat Intolerance,  Hot flashes and New Diabetes. Hematology Not Present- Blood Thinners, Easy Bruising, Excessive bleeding, Gland problems, HIV and Persistent Infections.  Vitals [employer] R. Brooks CMA; 09/13/2015 10:12 AM) 09/13/2015 10:12 AM Weight: 235.5 lb Height: 74in Body Surface Area: 2.33 m Body Mass Index: 30.24 kg/m  Pulse: 68 (Regular)  BP: 120/76 (Sitting, Left Arm,  Standard)       Physical Exam Corey Hensley; 09/13/2015 10:24 AM) General Mental Status-Alert. General Appearance-Consistent with stated age. Hydration-Well hydrated. Voice-Normal.  Head and Neck Head-normocephalic, atraumatic with no lesions or palpable masses. Trachea-midline. Thyroid Gland Characteristics - normal size and consistency.  Chest and Lung Exam Chest and lung exam reveals -quiet, even and easy respiratory effort with no use of accessory muscles and on auscultation, normal breath sounds, no adventitious sounds and normal vocal resonance. Inspection Chest Wall - Normal. Back - normal.  Cardiovascular Cardiovascular examination reveals -normal heart sounds, regular rate and rhythm with no murmurs and normal pedal pulses bilaterally.  Abdomen Inspection Skin - Scar - no surgical scars. Hernias - Umbilical hernia - Reducible. Note: Small approximately 1 cm umbilical hernia. Palpation/Percussion Normal exam - Soft, Non Tender, No Rebound tenderness, No Rigidity (guarding) and No hepatosplenomegaly. Auscultation Normal exam - Bowel sounds normal.    Assessment & Plan Corey Hensley; [id number] AM) UMBILICAL HERNIA WITHOUT OBSTRUCTION AND WITHOUT GANGRENE (K42.9) Impression: Patient is a 60 year old male with a reducible umbilical hernia.  1. The patient will like to proceed to the operating room for laparoscopic umbilical hernia repair with mesh.  2. I discussed with the patient the signs and symptoms of incarceration and strangulation and the need to proceed to the  ER should they occur.  3. I discussed with the patient the risks and benefits of the procedure to include but not limited to: Infection, bleeding, damage to surrounding structures, possible need for further surgery, possible nerve pain, and possible recurrence. The patient was understanding and wishes to proceed.

## 2015-09-14 NOTE — Discharge Instructions (Signed)
[facility] _______[facility] ° °UMBILICAL HERNIA REPAIR: POST OP INSTRUCTIONS ° °Always review your discharge instruction sheet given to you by the facility where your surgery was performed. °IF YOU HAVE DISABILITY OR FAMILY LEAVE FORMS, YOU MUST BRING THEM TO THE OFFICE FOR PROCESSING.   °DO NOT GIVE THEM TO YOUR DOCTOR. ° °1. A  prescription for pain medication may be given to you upon discharge.  Take your pain medication as prescribed, if needed.  If narcotic pain medicine is not needed, then you may take acetaminophen (Tylenol) or ibuprofen (Advil) as needed. °2. Take your usually prescribed medications unless otherwise directed. °3. If you need a refill on your pain medication, please contact your pharmacy.  They will contact our office to request authorization. Prescriptions will not be filled after 5 pm or on week-ends. °4. You should follow a light diet the first 24 hours after arrival home, such as soup and crackers, etc.  Be sure to include lots of fluids daily.  Resume your normal diet the day after surgery. °5. Most patients will experience some swelling and bruising around the umbilicus or in the groin and scrotum.  Ice packs and reclining will help.  Swelling and bruising can take several days to resolve.  °6. It is common to experience some constipation if taking pain medication after surgery.  Increasing fluid intake and taking a stool softener (such as Colace) will usually help or prevent this problem from occurring.  A mild laxative (Milk of Magnesia or Miralax) should be taken according to package directions if there are no bowel movements after 48 hours. °7. Unless discharge instructions indicate otherwise, you may remove your bandages 24-48 hours after surgery, and you may shower at that time.  You may have steri-strips (small skin tapes) in place directly over the incision.  These strips should be left on the skin for 7-10 days.  If your surgeon used skin glue on the incision, you  may shower in 24 hours.  The glue will flake off over the next 2-3 weeks.  Any sutures or staples will be removed at the office during your follow-up visit. °8. ACTIVITIES:  You may resume regular (light) daily activities beginning the next day--such as daily self-care, walking, climbing stairs--gradually increasing activities as tolerated.  You may have sexual intercourse when it is comfortable.  Refrain from any heavy lifting or straining until approved by your doctor. °a. You may drive when you are no longer taking prescription pain medication, you can comfortably wear a seatbelt, and you can safely maneuver your car and apply brakes. °b. RETURN TO WORK:  __________________________________________________________ °9. You should see your doctor in the office for a follow-up appointment approximately 2-3 weeks after your surgery.  Make sure that you call for this appointment within a day or two after you arrive home to insure a convenient appointment time. °10. OTHER INSTRUCTIONS:  __________________________________________________________________________________________________________________________________________________________________________________________  °WHEN TO CALL YOUR DOCTOR: °1. Fever over 101.0 °2. Inability to urinate °3. Nausea and/or vomiting °4. Extreme swelling or bruising °5. Continued bleeding from incision. °6. Increased pain, redness, or drainage from the incision ° °The clinic staff is available to answer your questions during regular business hours.  Please don’t hesitate to call and ask to speak to one of the nurses for clinical concerns.  If you have a medical emergency, go to the nearest emergency room or call 911.  A surgeon from [facility] is always on call at the hospital ° ° °[address], [address], [place], [place]  [address] ? ° [address] [address], [place], [place]   [address] °[phone] ? [phone] ? FAX [phone] °Web site:  [email or web address] ° °

## 2015-09-14 NOTE — Anesthesia Preprocedure Evaluation (Signed)
Anesthesia Evaluation  Patient identified by MRN, date of birth, ID band Patient awake    Reviewed: Allergy & Precautions, NPO status , Patient's Chart, lab work & pertinent test results  History of Anesthesia Complications Negative for: history of anesthetic complications  Airway Mallampati: III  TM Distance: >3 FB Neck ROM: Full    Dental  (+) Teeth Intact, Dental Advisory Given   Pulmonary sleep apnea and Continuous Positive Airway Pressure Ventilation , Recent URI , Resolved,    Pulmonary exam normal breath sounds clear to auscultation       Cardiovascular Exercise Tolerance: Good negative cardio ROS Normal cardiovascular exam Rhythm:Regular Rate:Normal     Neuro/Psych  Headaches, PSYCHIATRIC DISORDERS Anxiety    GI/Hepatic Neg liver ROS, GERD  Controlled,  Endo/Other  negative endocrine ROS  Renal/GU negative Renal ROS     Musculoskeletal  (+) Arthritis ,   Abdominal   Peds  Hematology negative hematology ROS (+)   Anesthesia Other Findings HLD  Reproductive/Obstetrics                            Anesthesia Physical Anesthesia Plan  ASA: II  Anesthesia Plan: General   Post-op Pain Management:    Induction: Intravenous  Airway Management Planned: Oral ETT  Additional Equipment:   Intra-op Plan:   Post-operative Plan: Extubation in OR  Informed Consent: I have reviewed the patients History and Physical, chart, labs and discussed the procedure including the risks, benefits and alternatives for the proposed anesthesia with the patient or authorized representative who has indicated his/her understanding and acceptance.   Dental advisory given  Plan Discussed with:   Anesthesia Plan Comments:         Anesthesia Quick Evaluation

## 2015-09-17 ENCOUNTER — Encounter (HOSPITAL_COMMUNITY): Payer: Self-pay | Admitting: General Surgery

## 2016-04-22 ENCOUNTER — Other Ambulatory Visit: Payer: Self-pay | Admitting: Orthopedic Surgery

## 2016-04-22 DIAGNOSIS — S73191A Other sprain of right hip, initial encounter: Secondary | ICD-10-CM

## 2016-05-05 DIAGNOSIS — Z9001 Acquired absence of eye: Secondary | ICD-10-CM | POA: Insufficient documentation

## 2016-05-08 ENCOUNTER — Other Ambulatory Visit: Payer: 59

## 2016-05-08 ENCOUNTER — Ambulatory Visit
Admission: RE | Admit: 2016-05-08 | Discharge: 2016-05-08 | Disposition: A | Discharge status: Home / Self Care | Payer: 59 | Source: Ambulatory Visit | Attending: Orthopedic Surgery | Admitting: Orthopedic Surgery

## 2016-05-08 DIAGNOSIS — S73191A Other sprain of right hip, initial encounter: Secondary | ICD-10-CM

## 2016-05-08 MED ORDER — IOPAMIDOL (ISOVUE-M 200) INJECTION 41%
17.0000 mL | Freq: Once | INTRAMUSCULAR | Status: AC
  Administered 2016-05-08: 17 mL via INTRA_ARTICULAR

## 2016-08-07 IMAGING — CT CT HEAD W/O CM
1 of 2 series · 16 of 30 positions shown, 20 images · non-contrast
Comparison: None.

CLINICAL DATA: Acute onset left arm numbness and dizziness

EXAM:
CT HEAD WITHOUT CONTRAST
TECHNIQUE: Contiguous axial images were obtained from the base of the skull
through the vertex without intravenous contrast.

[Series 3: head 2.0 h70h · axial · 0.46mm/px · z∈[-68,+78]mm · 16 of 83 slices shown, 20 images]
[im 5/83  brain]
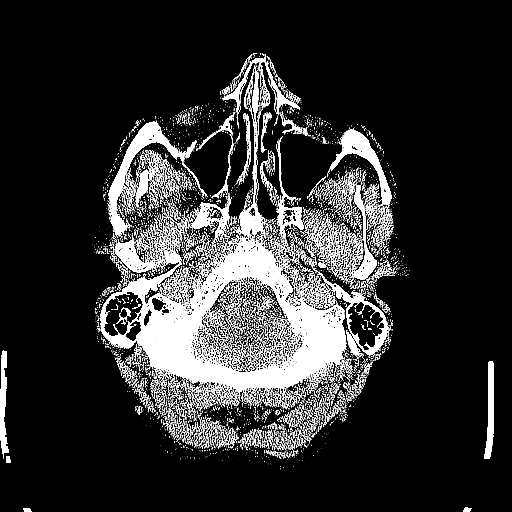
[im 5/83  bone]
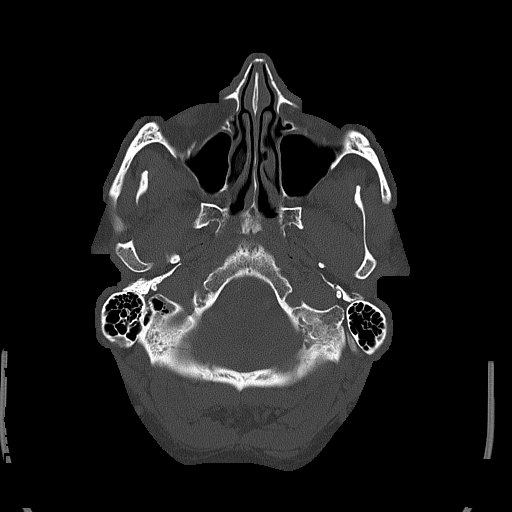
[im 9/83  brain]
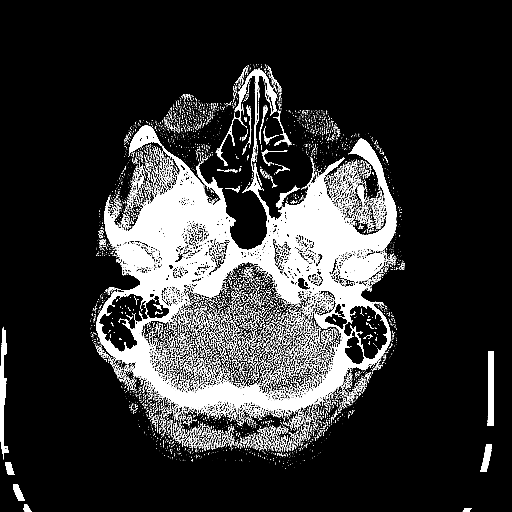
[im 13/83  brain]
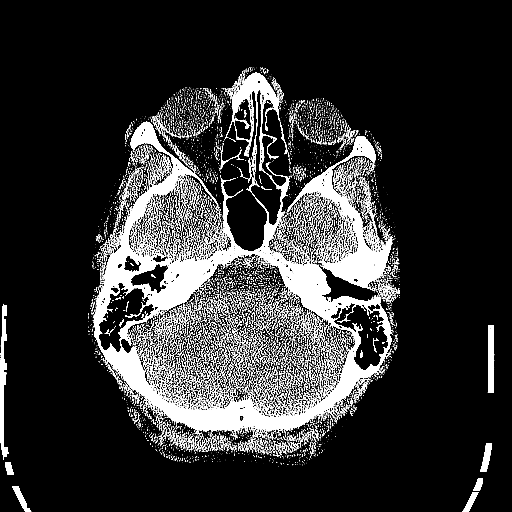
[im 21/83  brain]
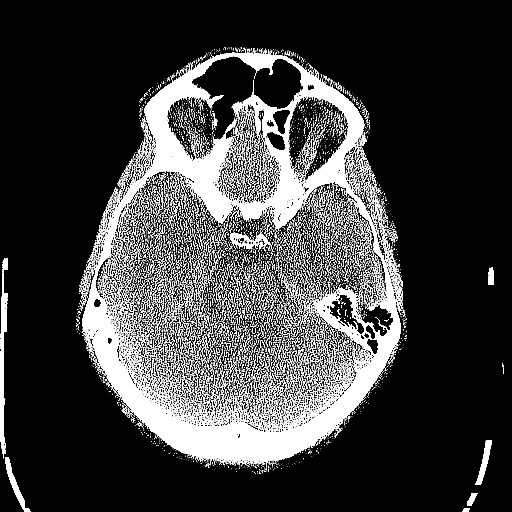
[im 25/83  brain]
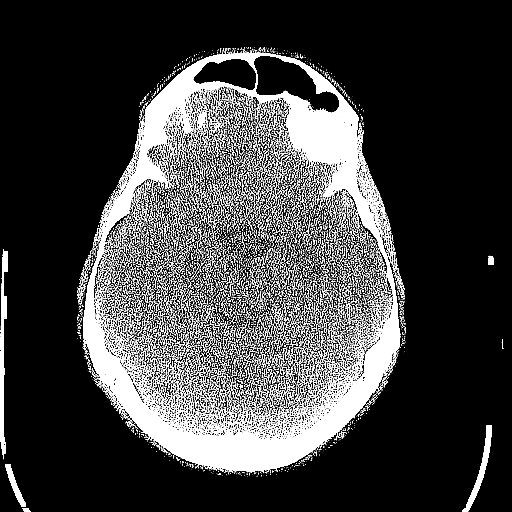
[im 25/83  bone]
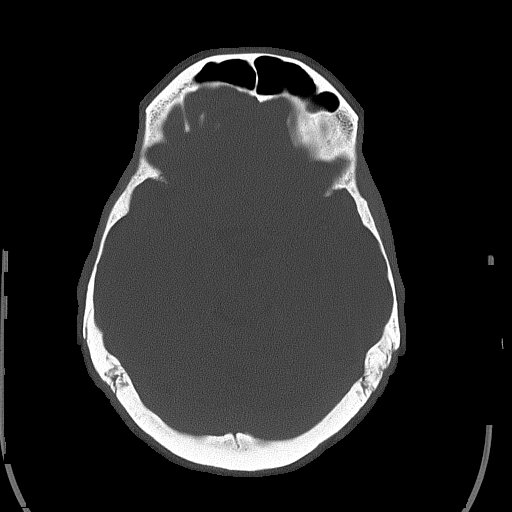
[im 29/83  brain]
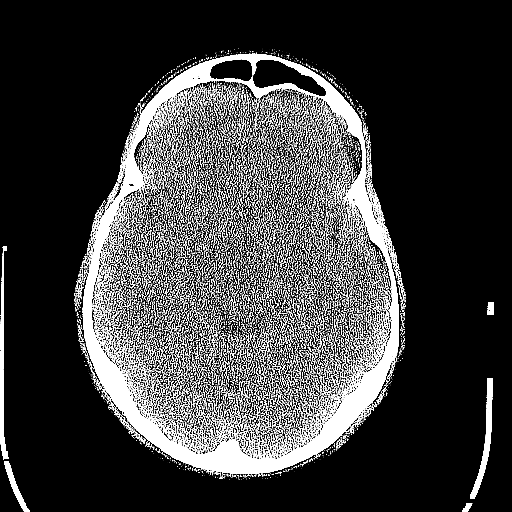
[im 33/83  brain]
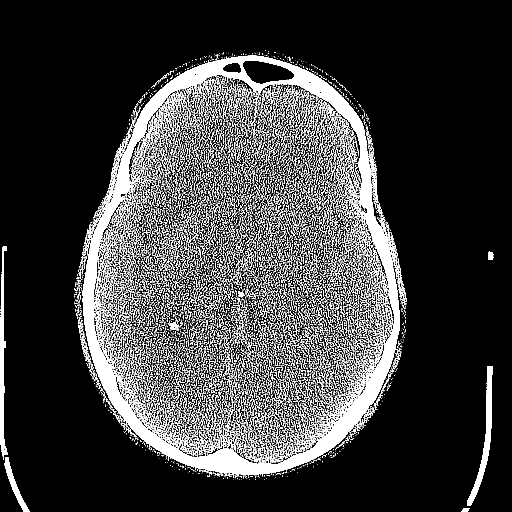
[im 37/83  brain]
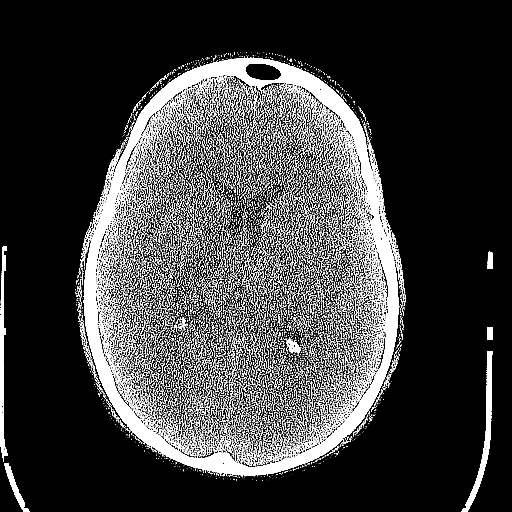
[im 46/83  brain]
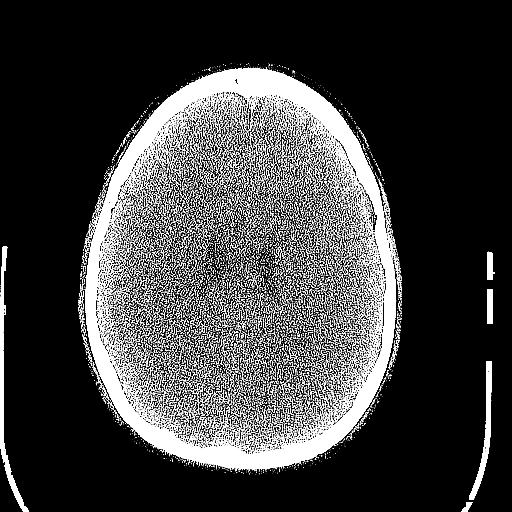
[im 46/83  bone]
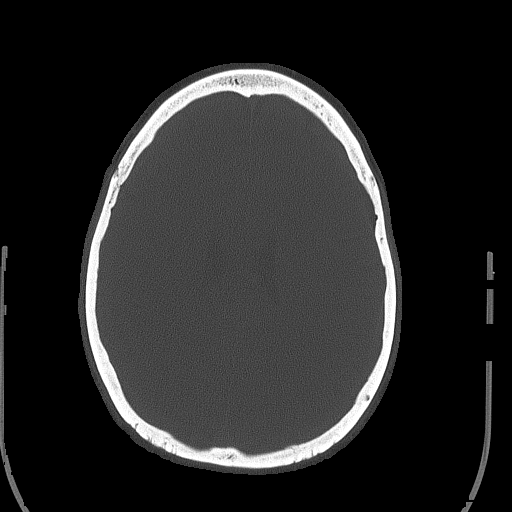
[im 50/83  brain]
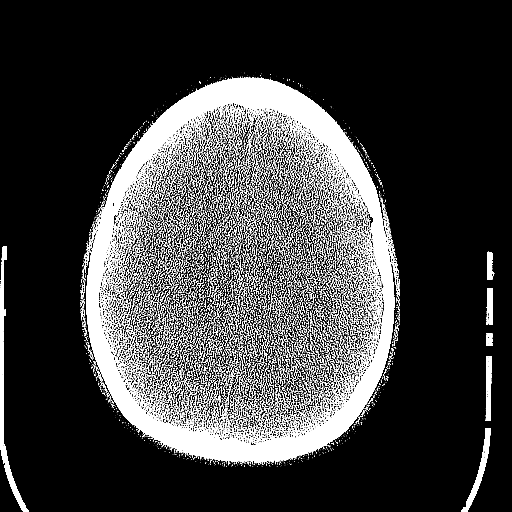
[im 54/83  brain]
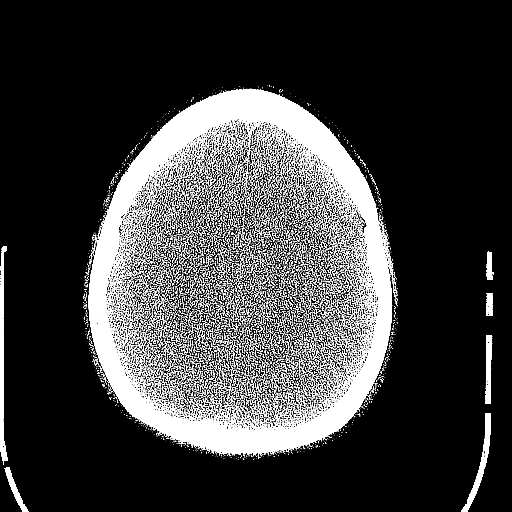
[im 58/83  brain]
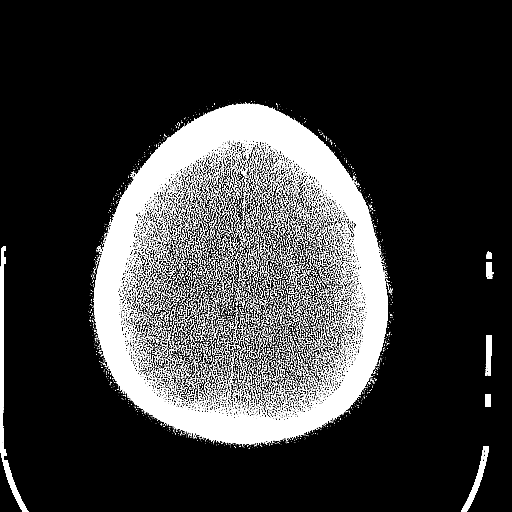
[im 62/83  brain]
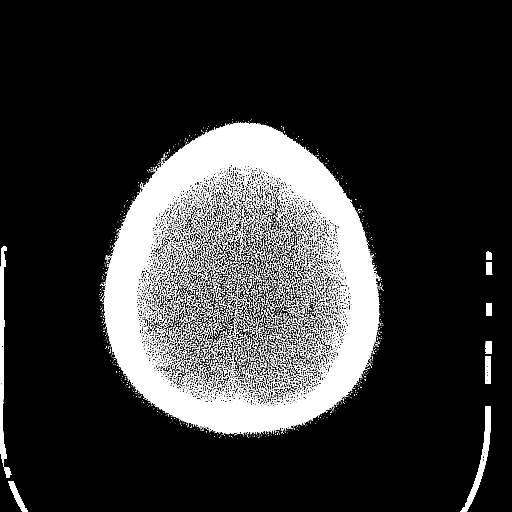
[im 62/83  bone]
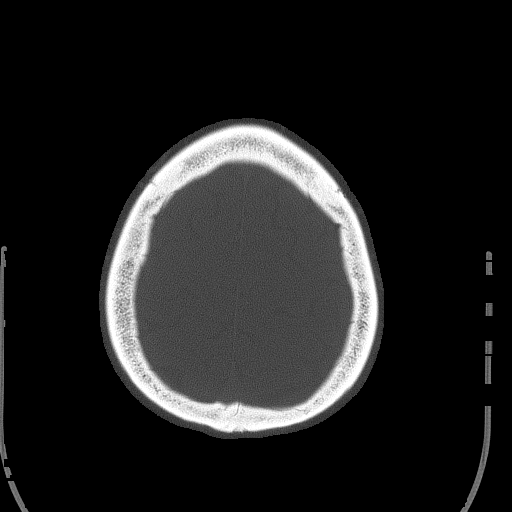
[im 70/83  brain]
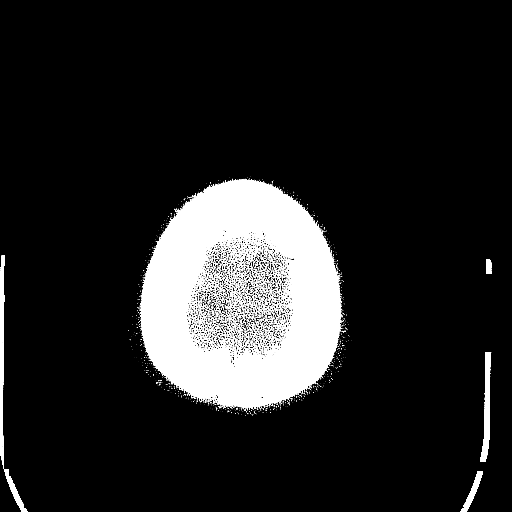
[im 74/83  brain]
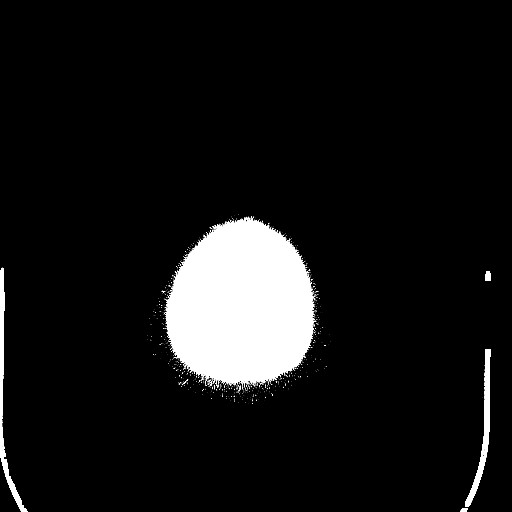
[im 78/83  brain]
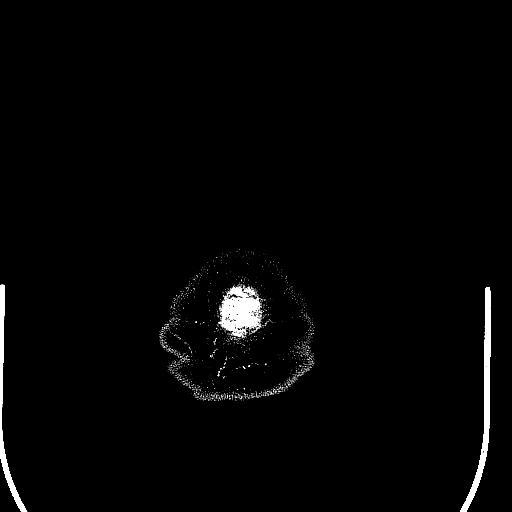

[16 of 30 positions shown; findings below may reference images not displayed]

FINDINGS: The ventricles are normal in size and configuration. There is no
intracranial mass, hemorrhage, extra-axial fluid collection, or
midline shift. The gray-white compartments are normal. No acute
infarct evident. Bony calvarium appears intact. The visualized
mastoid air cells are clear. The middle cerebral arteries show
symmetric and normal attenuation bilaterally.
IMPRESSION: No intracranial mass, hemorrhage, or focal gray -white compartment
lesions/acute appearing infarct.

Critical Value/emergent results were called by telephone at the time
of interpretation on 05/25/2014 at [DATE] to Dr. Gasca, neurology
, who verbally acknowledged these results.

## 2016-08-20 ENCOUNTER — Ambulatory Visit (INDEPENDENT_AMBULATORY_CARE_PROVIDER_SITE_OTHER): Payer: 59 | Admitting: Internal Medicine

## 2016-08-20 ENCOUNTER — Encounter: Payer: Self-pay | Admitting: Internal Medicine

## 2016-08-20 VITALS — BP 112/72 | HR 42 | Ht 74.0 in | Wt 221.0 lb

## 2016-08-20 DIAGNOSIS — G4733 Obstructive sleep apnea (adult) (pediatric): Secondary | ICD-10-CM

## 2016-08-20 DIAGNOSIS — J301 Allergic rhinitis due to pollen: Secondary | ICD-10-CM | POA: Diagnosis not present

## 2016-08-20 NOTE — Progress Notes (Signed)
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 Harold Hedge allergist) CPAP 10/[facility] FOLLOWS FOR: DME: [facility]; Wears CPAP every night for about 6-8 hours. Pt will need new supplies today. DL attached. Incidental cold today but otherwise has done well. Normal sleep schedule if left to his own is bedtime 2 AM, up at 10 AM. Using nasal pillows mask. Download is reviewed and discussed-okay. Compliance with four-hour guide is 89% with AHI 2.0/hour.  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 Harold Hedge allergist) CPAP 10/[facility] FOLLOWS FOR: DME [facility]. No DL and patient did not bring card-will need to place order for DL. Pt wears CPAP for at least 6 hours and pressure works well No download available this visit but he reports using CPAP every night. His machine is old enough now to consider replacing if needed and he can discuss that with his DME company. We discussed the availability of small travel size CPAP machines.  ROS-see HPI     + = pos Constitutional:   No-   weight loss, night sweats, fevers, chills, fatigue, lassitude. HEENT:   +  headaches, difficulty swallowing, tooth/dental problems, sore throat,       No-  sneezing, itching, ear ache, +seasonal nasal congestion, post nasal drip,  CV:  No-   chest pain, orthopnea, PND, swelling in lower extremities, anasarca, dizziness, palpitations Resp: No-   shortness of breath with exertion or at rest.              No-   productive cough,  No non-productive cough,  No- coughing up of blood.              No-   change in color of mucus.  No- wheezing.   Skin: No-   rash or lesions. GI:  No-   heartburn, indigestion,  abdominal pain, nausea, vomiting,  GU: MS:  No-   joint pain or swelling.     Neuro-     nothing unusual Psych:  No- change in mood or affect. No depression or anxiety.  No memory loss.  OBJ- General- Alert, Oriented, Affect-appropriate, Distress- none acute, tall, not obese Skin- + herpes type viral lesion right corner of mouth Lymphadenopathy- none Head- atraumatic            Eyes- Gross vision intact, PERRLA, conjunctivae clear secretions            Ears- Hearing, canals-normal            Nose- Clear, no-Septal dev, mucus, polyps, erosion, perforation             Throat- Mallampati III , mucosa clear , drainage- none, tonsils- atrophic,  Neck- flexible , trachea midline, no stridor , thyroid nl, carotid no bruit Chest - symmetrical excursion , unlabored           Heart/CV- RRR , no murmur , no gallop  , no rub, nl s1 s2                           - JVD- none , edema- none, stasis changes- none, varices- none           Lung- clear to P&A, wheeze- none, cough- none , dullness-none, rub- none  Chest wall-  Abd-  Br/ Gen/ Rectal- Not done, not indicated Extrem- cyanosis- none, clubbing, none, atrophy- none, strength- nl Neuro- grossly intact to observation

## 2016-08-20 NOTE — Patient Instructions (Signed)
DME [facility]     Ok to replace old CPAP machine, changing to auto 5-15, mask of choice, humidifier, supplies, AirView   Examples of small travel CPAP machines- Transcend, AirMini     Please call if we can help

## 2016-08-20 NOTE — Assessment & Plan Note (Signed)
If he chooses to replace old CPAP machine now, we will take the opportunity to change to AutoPap 5-20 for greater flexibility. He clearly feels he benefits from CPAP and is comfortable using it each night.

## 2016-08-20 NOTE — Assessment & Plan Note (Signed)
Rhinitis is nonobstructive, not interfering with CPAP. Adequately managed with Flonase.

## 2016-12-16 ENCOUNTER — Encounter: Payer: Self-pay | Admitting: Internal Medicine

## 2016-12-16 ENCOUNTER — Telehealth: Payer: Self-pay | Admitting: Internal Medicine

## 2016-12-16 NOTE — Telephone Encounter (Signed)
Patient called back. He stated that he will need an OV soon for his CPAP machine so that his insurance will continue to pay for it and the supplies. Advised patient that I have sent a message to Joellen Jersey to see if we can work him in asap. He verbalized understanding.

## 2016-12-16 NOTE — Telephone Encounter (Signed)
Per Maryfrances Bunnell, it appears that pt received machine 83 days ago. Pt has been scheduled for ROV 12/18/16 with [facility]. Pt is aware and voiced his understanding. Nothing further needed.

## 2016-12-16 NOTE — Telephone Encounter (Signed)
Pt last seen 08-2016 was given new CPAP machine(replacement) Please find out when patient rec'd new CPAP and go 31 to 90 days from that date-use RNA slot for appt. 30 minutes. Thanks

## 2016-12-16 NOTE — Telephone Encounter (Signed)
Left message for patient to call back.   Katie, when he calls back, is it ok for [place] to use one of [facility]'s RNA spots? Thanks!

## 2016-12-18 ENCOUNTER — Encounter: Payer: Self-pay | Admitting: Internal Medicine

## 2016-12-18 ENCOUNTER — Ambulatory Visit (INDEPENDENT_AMBULATORY_CARE_PROVIDER_SITE_OTHER): Payer: 59 | Admitting: Internal Medicine

## 2016-12-18 VITALS — BP 122/76 | HR 47 | Ht 74.0 in | Wt 217.0 lb

## 2016-12-18 DIAGNOSIS — J301 Allergic rhinitis due to pollen: Secondary | ICD-10-CM | POA: Diagnosis not present

## 2016-12-18 DIAGNOSIS — G4733 Obstructive sleep apnea (adult) (pediatric): Secondary | ICD-10-CM

## 2016-12-18 NOTE — Progress Notes (Signed)
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 Harold Hedge allergist) CPAP 10/[facility] FOLLOWS FOR: DME [facility]. No DL and patient did not bring card-will need to place order for DL. Pt wears CPAP for at least 6 hours and pressure works well No download available this visit but he reports using CPAP every night. His machine is old enough now to consider replacing if needed and he can discuss that with his DME company. We discussed the availability of small travel size CPAP machines.  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 Harold Hedge allergist) CPAP auto 5-10/[facility] -Pt states he has been doing good. Here for an OV after receiving new CPAP machine. DME: [facility].-- He likes his CPAP machine and sleeps well with it.  The auto pressure range is comfortable.  He feels well with no concerns.  His preferred sleep pattern is bedtime around 2 AM and up the morning.  To gym as late it is open-9 or 10 PM Download 90% compliance averaging X hours per night, AHI 1.9/hour  ROS-see HPI     + = pos Constitutional:   No-   weight loss, night sweats, fevers, chills, fatigue, lassitude. HEENT:   +  headaches, difficulty swallowing, tooth/dental problems, sore throat,       No-  sneezing, itching, ear ache, +seasonal nasal congestion, post nasal drip,  CV:  No-   chest pain, orthopnea, PND, swelling in lower extremities, anasarca, dizziness, palpitations Resp: No-   shortness of breath with exertion or at rest.              No-   productive cough,  No non-productive cough,  No- coughing up of blood.              No-   change in color of mucus.  No- wheezing.   Skin: No-   rash or  lesions. GI:  No-   heartburn, indigestion, abdominal pain, nausea, vomiting,  GU: MS:  No-   joint pain or swelling.     Neuro-     nothing unusual Psych:  No- change in mood or affect. No depression or anxiety.  No memory loss.  OBJ- General- Alert, Oriented, Affect-appropriate, Distress- none acute, tall/ fit Skin- + herpes type viral lesion right corner of mouth Lymphadenopathy- none Head- atraumatic            Eyes- Gross vision intact, PERRLA, conjunctivae clear secretions            Ears- Hearing, canals-normal            Nose- Clear, no-Septal dev, mucus, polyps, erosion, perforation             Throat- Mallampati III , mucosa clear , drainage- none, tonsils- atrophic,  Neck- flexible , trachea midline, no stridor , thyroid nl, carotid no bruit Chest - symmetrical excursion , unlabored           Heart/CV- RRR , no murmur , no gallop  , no rub, nl s1 s2                           - JVD- none , edema- none, stasis changes- none, varices- none  Lung- clear to P&A, wheeze- none, cough- none , dullness-none, rub- none           Chest wall-  Abd-  Br/ Gen/ Rectal- Not done, not indicated Extrem- cyanosis- none, clubbing, none, atrophy- none, strength- nl Neuro- grossly intact to observation

## 2016-12-18 NOTE — Patient Instructions (Addendum)
We can continue CPAP auto 5-15, mask of choice, humidifier, supplies, AirView,  Dx OSA  Ok to keep the appointment in July unless you need to change.  Please call if we can help

## 2016-12-21 NOTE — Assessment & Plan Note (Signed)
Oertli adequately controlled and not interfering with CPAP use.  Managed by his allergist.

## 2016-12-21 NOTE — Assessment & Plan Note (Signed)
Satisfied that he benefits from CPAP and download confirms good compliance and control.  He likes AutoPap.  No changes are needed.  We discussed comfort.

## 2017-05-03 ENCOUNTER — Encounter: Payer: Self-pay | Admitting: Internal Medicine

## 2017-08-20 ENCOUNTER — Ambulatory Visit: Payer: 59 | Admitting: Internal Medicine

## 2017-09-29 ENCOUNTER — Ambulatory Visit: Payer: 59 | Admitting: Internal Medicine

## 2017-10-27 ENCOUNTER — Ambulatory Visit (INDEPENDENT_AMBULATORY_CARE_PROVIDER_SITE_OTHER): Payer: 59 | Admitting: Internal Medicine

## 2017-10-27 ENCOUNTER — Encounter: Payer: Self-pay | Admitting: Internal Medicine

## 2017-10-27 VITALS — BP 118/74 | HR 54 | Ht 74.0 in | Wt 224.6 lb

## 2017-10-27 DIAGNOSIS — Z23 Encounter for immunization: Secondary | ICD-10-CM | POA: Diagnosis not present

## 2017-10-27 DIAGNOSIS — G4733 Obstructive sleep apnea (adult) (pediatric): Secondary | ICD-10-CM | POA: Diagnosis not present

## 2017-10-27 DIAGNOSIS — J301 Allergic rhinitis due to pollen: Secondary | ICD-10-CM

## 2017-10-27 NOTE — Progress Notes (Signed)
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 Harold Hedge allergist), GERD Home unattended sleep study 11/08/10 - AHI 28.4/hr -----------------------------------------------------------------------------------  12/18/16- 62 year old male never smoker followed for OSA, complicated by SAR (Dr. Harold Hedge allergist) CPAP auto 5-10/[facility] -Pt states he has been doing good. Here for an OV after receiving new CPAP machine. DME: [facility].-- He likes his CPAP machine and sleeps well with it.  The auto pressure range is comfortable.  He feels well with no concerns.  His preferred sleep pattern is bedtime around 2 AM and up the morning.  To gym as late it is open-9 or 10 PM Download 90% compliance averaging X hours per night, AHI 1.9/hour  10/27/2017- 62 year old male never smoker followed for OSA, complicated by SAR, GERD, CPAP auto 5-15/[facility] -----OSA; DME [facility]. No DL today as he does not have SD Card. Dme was to send DL to [place].  No download this visit Replaced CPAP last year.  Says it is doing very well but he would like to get a small travel machine for frequent trips-discussed.  Sleeps very well. Resolving incidental viral syndrome upper respiratory infection and we decided to go ahead with flu shot while here.  ROS-see HPI     + = positive Constitutional:   No-   weight loss, night sweats, fevers, chills, fatigue, lassitude. HEENT:   +  headaches, difficulty swallowing, tooth/dental problems, sore throat,       No-  sneezing, itching, ear ache, +seasonal nasal congestion, post nasal drip,  CV:  No-   chest pain, orthopnea, PND, swelling in lower extremities, anasarca, dizziness, palpitations Resp: No-   shortness of breath with exertion or at rest.              No-   productive cough,  No non-productive cough,  No- coughing up of blood.              No-   change in color of mucus.  No- wheezing.   Skin: No-   rash or lesions. GI:  No-    heartburn, indigestion, abdominal pain, nausea, vomiting,  GU: MS:  No-   joint pain or swelling.     Neuro-     nothing unusual Psych:  No- change in mood or affect. No depression or anxiety.  No memory loss.  OBJ- General- Alert, Oriented, Affect-appropriate, Distress- none acute, tall/ fit Skin- -no rash Lymphadenopathy- none Head- atraumatic            Eyes- Gross vision intact, PERRLA, conjunctivae clear secretions            Ears- Hearing, canals-normal            Nose- Clear, no-Septal dev, mucus, polyps, erosion, perforation             Throat- Mallampati III , mucosa clear , drainage- none, tonsils- atrophic,  Neck- flexible , trachea midline, no stridor , thyroid nl, carotid no bruit Chest - symmetrical excursion , unlabored           Heart/CV- RRR , no murmur , no gallop  , no rub, nl s1 s2                           - JVD- none , edema- none, stasis changes- none, varices- none           Lung- clear to P&A, wheeze- none, cough-  none , dullness-none, rub- none           Chest wall-  Abd-  Br/ Gen/ Rectal- Not done, not indicated Extrem- cyanosis- none, clubbing, none, atrophy- none, strength- nl Neuro- grossly intact to observation

## 2017-10-27 NOTE — Patient Instructions (Addendum)
We can continue CPAP auto 5-15, mask of choice, humidifier, supplies, AirView or DL card  Order- Flu  vax standard  Please call if we can help

## 2017-10-29 NOTE — Assessment & Plan Note (Signed)
He gets meds when needed from his PCP. Plan-standard flu shot today

## 2017-10-29 NOTE — Assessment & Plan Note (Addendum)
His compliance and control have been very good and he continues to benefit from CPAP with improved sleep.  Needs replacement for download card but otherwise no changes. Plan-continue CPAP auto 5-15, replace download card, printed prescription he can use online at his request for travel CPAP machine

## 2018-11-04 ENCOUNTER — Ambulatory Visit: Payer: 59 | Admitting: Internal Medicine

## 2018-11-23 ENCOUNTER — Encounter: Payer: Self-pay | Admitting: Internal Medicine

## 2018-11-23 ENCOUNTER — Ambulatory Visit (INDEPENDENT_AMBULATORY_CARE_PROVIDER_SITE_OTHER): Payer: Self-pay | Admitting: Internal Medicine

## 2018-11-23 DIAGNOSIS — G4733 Obstructive sleep apnea (adult) (pediatric): Secondary | ICD-10-CM

## 2018-11-23 DIAGNOSIS — J301 Allergic rhinitis due to pollen: Secondary | ICD-10-CM

## 2018-11-23 NOTE — Assessment & Plan Note (Signed)
He works with his allergist and indicates rhinitis is not interfering with CPAP use.Corey Hensley

## 2018-11-23 NOTE — Patient Instructions (Signed)
We can continue CPAP auto 5-15, mask of choice, humidifier, supplies, AirView/ card  Don't forget that flu shot !  Please call if we can help

## 2018-11-23 NOTE — Progress Notes (Signed)
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 Harold Hedge allergist), GERD Home unattended sleep study 11/08/10 - AHI 28.4/hr -----------------------------------------------------------------------------------  10/27/2017- 63 year old male never smoker followed for OSA, complicated by SAR, GERD, CPAP auto 5-15/[facility] -----OSA; DME [facility]. No DL today as he does not have SD Card. Dme was to send DL to [place].  No download this visit Replaced CPAP last year.  Says it is doing very well but he would like to get a small travel machine for frequent trips-discussed.  Sleeps very well. Resolving incidental viral syndrome upper respiratory infection and we decided to go ahead with flu shot while here.  11/23/2018- Virtual Visit via Telephone Note  I connected with Corey Hensley on 11/23/18 at 11:00 AM EDT by telephone and verified that I am speaking with the correct person using two identifiers.  Location: Patient: H Provider: O   I discussed the limitations, risks, security and privacy concerns of performing an evaluation and management service by telephone and the availability of in person appointments. I also discussed with the patient that there may be a patient responsible charge related to this service. The patient expressed understanding and agreed to proceed.   History of Present Illness:  63 year old male never smoker followed for OSA, complicated by SAR, GERD, CPAP auto 5-15/[facility] -----OSA on CPAP, DME: [facility]; no complaints, machine 1-2 y.o. Very satisfied with his CPAP and comfortable with pressure. Gets supplies when needed from DME.  Denies additional health issues of concern at this time.  Observations/Objective: No download- televisti  Assessment and Plan: OSA- continues to benefit- plan no change CPAP auto 5-15  Follow Up Instructions: 1 year   I discussed the assessment and treatment plan with the patient. The patient  was provided an opportunity to ask questions and all were answered. The patient agreed with the plan and demonstrated an understanding of the instructions.   The patient was advised to call back or seek an in-person evaluation if the symptoms worsen or if the condition fails to improve as anticipated.  I provided 15 minutes of non-face-to-face time during this encounter.   Baird Lyons, MD    ROS-see HPI     + = positive Constitutional:   No-   weight loss, night sweats, fevers, chills, fatigue, lassitude. HEENT:   +  headaches, difficulty swallowing, tooth/dental problems, sore throat,       No-  sneezing, itching, ear ache, +seasonal nasal congestion, post nasal drip,  CV:  No-   chest pain, orthopnea, PND, swelling in lower extremities, anasarca, dizziness, palpitations Resp: No-   shortness of breath with exertion or at rest.              No-   productive cough,  No non-productive cough,  No- coughing up of blood.              No-   change in color of mucus.  No- wheezing.   Skin: No-   rash or lesions. GI:  No-   heartburn, indigestion, abdominal pain, nausea, vomiting,  GU: MS:  No-   joint pain or swelling.     Neuro-     nothing unusual Psych:  No- change in mood or affect. No depression or anxiety.  No memory loss.  OBJ- General- Alert, Oriented, Affect-appropriate, Distress- none acute, tall/ fit Skin- -no rash Lymphadenopathy- none Head- atraumatic            Eyes-  Gross vision intact, PERRLA, conjunctivae clear secretions            Ears- Hearing, canals-normal            Nose- Clear, no-Septal dev, mucus, polyps, erosion, perforation             Throat- Mallampati III , mucosa clear , drainage- none, tonsils- atrophic,  Neck- flexible , trachea midline, no stridor , thyroid nl, carotid no bruit Chest - symmetrical excursion , unlabored           Heart/CV- RRR , no murmur , no gallop  , no rub, nl s1 s2                           - JVD- none , edema- none, stasis  changes- none, varices- none           Lung- clear to P&A, wheeze- none, cough- none , dullness-none, rub- none           Chest wall-  Abd-  Br/ Gen/ Rectal- Not done, not indicated Extrem- cyanosis- none, clubbing, none, atrophy- none, strength- nl Neuro- grossly intact to observation

## 2018-11-23 NOTE — Assessment & Plan Note (Signed)
He benefits from CPAP with better sleep and reports good compliance and control. No change needs identified. Machine is working well. Plan continue auto 5-15

## 2018-12-02 ENCOUNTER — Other Ambulatory Visit: Payer: Self-pay

## 2018-12-02 ENCOUNTER — Ambulatory Visit (INDEPENDENT_AMBULATORY_CARE_PROVIDER_SITE_OTHER): Payer: No Typology Code available for payment source

## 2018-12-02 ENCOUNTER — Ambulatory Visit (INDEPENDENT_AMBULATORY_CARE_PROVIDER_SITE_OTHER): Payer: No Typology Code available for payment source | Admitting: Podiatry

## 2018-12-02 ENCOUNTER — Encounter: Payer: Self-pay | Admitting: Podiatry

## 2018-12-02 VITALS — BP 145/76 | HR 67 | Resp 16

## 2018-12-02 DIAGNOSIS — S9031XA Contusion of right foot, initial encounter: Secondary | ICD-10-CM

## 2018-12-02 DIAGNOSIS — Z973 Presence of spectacles and contact lenses: Secondary | ICD-10-CM | POA: Insufficient documentation

## 2018-12-02 DIAGNOSIS — M67472 Ganglion, left ankle and foot: Secondary | ICD-10-CM

## 2018-12-02 DIAGNOSIS — T148XXA Other injury of unspecified body region, initial encounter: Secondary | ICD-10-CM

## 2018-12-02 MED ORDER — DICLOFENAC SODIUM 75 MG PO TBEC: 75.0000 mg | DELAYED_RELEASE_TABLET | Freq: Two times a day (BID) | ORAL | 2 refills | Status: AC

## 2018-12-05 NOTE — Progress Notes (Signed)
Subjective:   Patient ID: Corey Hensley, male   DOB: 63 y.o.   MRN: [id number]   HPI Patient states that he felt a pop while dancing 3 weeks ago and has developed discomfort in the right foot that is worse with activity and being on his foot.  Also was concerned about a small lump in his second digit left that is not painful but at times hurts to bend his toe.  Patient does not smoke   Review of Systems  All other systems reviewed and are negative.       Objective:  Physical Exam Vitals signs and nursing note reviewed.  Constitutional:      Appearance: He is well-developed.  Pulmonary:     Effort: Pulmonary effort is normal.  Musculoskeletal: Normal range of motion.  Skin:    General: Skin is warm.  Neurological:     Mental Status: He is alert.     Neurovascular status was found to be intact muscle strength was found to be adequate range of motion within normal limits.  Patient was noted to have on the right quite a bit of discomfort in the plantar fascia and upon evaluation versus the left I did note that it appears there is been a tear of the plantar fascia.  On the left second digit there is a small subcutaneous nodule plantar left second digit but is localized with no erythema edema or pain associated with pressure to the area.  Patient has good digital perfusion well oriented x3     Assessment:  Probability for a tear of the plantar fascial right secondary to injury with a small probable subcutaneous cyst ganglion on second digit left plantar     Plan:  H&P reviewed both conditions and recommended immobilization for the right and placed into an air fracture walker for several weeks to allow for healing.  Gave advice on gradual stretching and ice therapy and reappoint to recheck and do not recommend current treatment for the left  X-rays were negative for signs of bony injury appears to be strictly soft tissue currently

## 2018-12-30 ENCOUNTER — Ambulatory Visit: Payer: No Typology Code available for payment source | Admitting: Podiatry

## 2018-12-30 ENCOUNTER — Other Ambulatory Visit: Payer: Self-pay

## 2018-12-30 DIAGNOSIS — T148XXA Other injury of unspecified body region, initial encounter: Secondary | ICD-10-CM | POA: Diagnosis not present

## 2019-01-03 NOTE — Progress Notes (Signed)
Subjective:   Patient ID: Corey Hensley, male   DOB: 63 y.o.   MRN: [id number]   HPI Patient presents stating he seems to be improving but still is having some discomfort in his right arch and states while wearing the boot he feels good but at other times he still has pain and cannot wear a conventional tennis shoe currently   ROS      Objective:  Physical Exam  Neurovascular status intact with mid arch discomfort right with probability for torn plantar fascia that is improving quite a bit with boot usage but still is pathologic     Assessment:  Probability for torn plantar fascial right that has improved but present     Plan:  Reviewed condition and recommended continued conservative care consisting of boot usage and ice therapy along with stretching exercises.  I want to see this back again in 4 weeks and if symptoms are not improving we may consider MRI

## 2019-01-27 ENCOUNTER — Encounter: Payer: Self-pay | Admitting: Podiatry

## 2019-01-27 ENCOUNTER — Ambulatory Visit: Payer: No Typology Code available for payment source | Admitting: Podiatry

## 2019-01-27 ENCOUNTER — Other Ambulatory Visit: Payer: Self-pay

## 2019-01-27 DIAGNOSIS — T148XXA Other injury of unspecified body region, initial encounter: Secondary | ICD-10-CM | POA: Diagnosis not present

## 2019-01-27 DIAGNOSIS — S9031XA Contusion of right foot, initial encounter: Secondary | ICD-10-CM

## 2019-01-31 NOTE — Progress Notes (Signed)
Subjective:   Patient ID: Corey Hensley, male   DOB: 63 y.o.   MRN: [id number]   HPI Patient states the pain has reduced quite a bit and states that he is feeling like he is getting over the tear that he experienced   ROS      Objective:  Physical Exam  Neurovascular status intact with patient having had a tear of the plantar fascial right which is no longer as sore as it was with 1 area of significant discomfort in the mid arch area right     Assessment:  Plantar fascial tear right with inflammation noted that is improving but still present      Plan:  H&P reviewed condition discussing different treatment options and utilization of boot along with heat ice therapy and gradual return to activity.  Patient will be seen back and is encouraged to call with questions concerns

## 2019-11-23 ENCOUNTER — Ambulatory Visit (INDEPENDENT_AMBULATORY_CARE_PROVIDER_SITE_OTHER): Payer: No Typology Code available for payment source | Admitting: Internal Medicine

## 2019-11-23 ENCOUNTER — Encounter: Payer: Self-pay | Admitting: Internal Medicine

## 2019-11-23 ENCOUNTER — Other Ambulatory Visit: Payer: Self-pay

## 2019-11-23 VITALS — BP 108/64 | HR 50 | Temp 97.7°F | Ht 74.0 in | Wt 239.2 lb

## 2019-11-23 DIAGNOSIS — G4733 Obstructive sleep apnea (adult) (pediatric): Secondary | ICD-10-CM

## 2019-11-23 DIAGNOSIS — Z9001 Acquired absence of eye: Secondary | ICD-10-CM | POA: Diagnosis not present

## 2019-11-23 NOTE — Patient Instructions (Signed)
We can continue CPAP auto 5-15, mask of choice, humidifier, supplies, AirView/ card  Please call if we can help

## 2019-11-23 NOTE — Progress Notes (Signed)
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 Harold Hedge allergist), GERD Home unattended sleep study 11/08/10 - AHI 28.4/hr -----------------------------------------------------------------------------------   11/23/2018- Virtual Visit via Telephone Note  History of Present Illness:  64 year old male never smoker followed for OSA, complicated by SAR, GERD, CPAP auto 5-15/[facility] -----OSA on CPAP, DME: [facility]; no complaints, machine 1-2 y.o. Very satisfied with his CPAP and comfortable with pressure. Gets supplies when needed from DME.  Denies additional health issues of concern at this time.  Observations/Objective: No download- televisti  Assessment and Plan: OSA- continues to benefit- plan no change CPAP auto 5-15  Follow Up Instructions: 1 year    11/23/19-  64 year old male never smoker followed for OSA, complicated by SAR, GERD, CPAP auto 5-15/[facility] Download- Body weight today- 239 lbs Covid vax- 2 Phizer Flu vax- had Wears CPAP 6-7 hrs/ night.  Very satisfied, no concerns.  ROS-see HPI     + = positive Constitutional:   No-   weight loss, night sweats, fevers, chills, fatigue, lassitude. HEENT:   +  headaches, difficulty swallowing, tooth/dental problems, sore throat,       No-  sneezing, itching, ear ache, +seasonal nasal congestion, post nasal drip,  CV:  No-   chest pain, orthopnea, PND, swelling in lower extremities, anasarca, dizziness, palpitations Resp: No-   shortness of breath with exertion or at rest.              No-   productive cough,  No non-productive cough,  No- coughing up of blood.              No-   change in color of mucus.  No- wheezing.   Skin: No-   rash or lesions. GI:  No-   heartburn, indigestion, abdominal pain, nausea, vomiting,  GU: MS:  No-   joint pain or swelling.     Neuro-     nothing unusual Psych:  No- change in mood or affect. No depression or anxiety.  No memory loss.  OBJ- General-  Alert, Oriented, Affect-appropriate, Distress- none acute, tall/ fit Skin- -no rash Lymphadenopathy- none Head- atraumatic            Eyes- Gross vision intact, PERRLA, conjunctivae clear secretions            Ears- Hearing, canals-normal            Nose- Clear, no-Septal dev, mucus, polyps, erosion, perforation             Throat- Mallampati III , mucosa clear , drainage- none, tonsils- atrophic,  Neck- flexible , trachea midline, no stridor , thyroid nl, carotid no bruit Chest - symmetrical excursion , unlabored           Heart/CV- RRR , no murmur , no gallop  , no rub, nl s1 s2                           - JVD- none , edema- none, stasis changes- none, varices- none           Lung- clear to P&A, wheeze- none, cough- none , dullness-none, rub- none           Chest wall-  Abd-  Br/ Gen/ Rectal- Not done, not indicated Extrem- cyanosis- none, clubbing, none, atrophy- none, strength- nl Neuro- grossly intact to observation

## 2019-11-27 ENCOUNTER — Encounter: Payer: Self-pay | Admitting: Internal Medicine

## 2019-11-27 NOTE — Assessment & Plan Note (Signed)
Benefits from CPAP,describing good compliance and conytrol with no concerns Plan- continue auto 5-15

## 2019-11-27 NOTE — Assessment & Plan Note (Signed)
Need to verify- may be incorrect

## 2019-12-22 ENCOUNTER — Ambulatory Visit (INDEPENDENT_AMBULATORY_CARE_PROVIDER_SITE_OTHER): Payer: No Typology Code available for payment source | Admitting: Otolaryngology

## 2019-12-22 ENCOUNTER — Other Ambulatory Visit: Payer: Self-pay

## 2019-12-22 ENCOUNTER — Encounter (INDEPENDENT_AMBULATORY_CARE_PROVIDER_SITE_OTHER): Payer: Self-pay | Admitting: Otolaryngology

## 2019-12-22 VITALS — Temp 97.3°F

## 2019-12-22 DIAGNOSIS — K219 Gastro-esophageal reflux disease without esophagitis: Secondary | ICD-10-CM

## 2019-12-22 DIAGNOSIS — J029 Acute pharyngitis, unspecified: Secondary | ICD-10-CM

## 2019-12-22 NOTE — Progress Notes (Signed)
HPI: Corey Hensley is a 64 y.o. male who presents is referred by Dr. Mancel Bale for evaluation of throat pain.  Symptoms initially began four 5 years ago as he was having a lot of reflux problems for which he used to take Tums.  He got to the point where he was having some coughing and throat discomfort.  He was recently started on Prilosec by Dr. Mancel Bale and is doing much better since taking the Prilosec.  He takes Prilosec for [employer] in the morning.  He describes pain low in the neck and low in the throat in the region of the cricoid cartilage.  He does not smoke.Marland Kitchen  Past Medical History:  Diagnosis Date  . Anxiety    on an airplane  - one time  . Arthritis   . Chronic headaches    migraines  . Dizziness   . Environmental allergies   . GERD (gastroesophageal reflux disease)   . High cholesterol   . History of shingles   . Pneumonia   . Sleep apnea    uses cpap   Past Surgical History:  Procedure Laterality Date  . APPENDECTOMY    . EYE SURGERY Bilateral    cataract surgery with lens implant on Right  . INSERTION OF MESH N/A 09/14/2015   Procedure: INSERTION OF MESH;  Surgeon: Ralene Ok, MD;  Location: [place];  Service: General;  Laterality: N/A;  . knee/ankle surgeries    . ROTATOR CUFF REPAIR    . UMBILICAL HERNIA REPAIR N/A 09/14/2015   Procedure: LAPAROSCOPIC UMBILICAL HERNIA;  Surgeon: Ralene Ok, MD;  Location: [place];  Service: General;  Laterality: N/A;   Social History   Socioeconomic History  . Marital status: Married    Spouse name: Not on file  . Number of children: 1  . Years of education: 20  . Highest education level: Not on file  Occupational History  . Occupation: CFO  Tobacco Use  . Smoking status: Never Smoker  . Smokeless tobacco: Never Used  Substance and Sexual Activity  . Alcohol use: Yes    Alcohol/week: 2.0 standard drinks    Types: 2 Standard drinks or equivalent per week    Comment: 4 drinks per month  . Drug use: No  . Sexual activity:  Not on file  Other Topics Concern  . Not on file  Social History Narrative   Lives at home with wife.   One cup caffeine daily.   Right-handed.   Social Determinants of Health   Financial Resource Strain:   . Difficulty of Paying Living Expenses: Not on file  Food Insecurity:   . Worried About Charity fundraiser in the Last Year: Not on file  . Ran Out of Food in the Last Year: Not on file  Transportation Needs:   . Lack of Transportation (Medical): Not on file  . Lack of Transportation (Non-Medical): Not on file  Physical Activity:   . Days of Exercise per Week: Not on file  . Minutes of Exercise per Session: Not on file  Stress:   . Feeling of Stress : Not on file  Social Connections:   . Frequency of Communication with Friends and Family: Not on file  . Frequency of Social Gatherings with Friends and Family: Not on file  . Attends Religious Services: Not on file  . Active Member of Clubs or Organizations: Not on file  . Attends Archivist Meetings: Not on file  . Marital Status: Not on  file   Family History  Problem Relation Age of Onset  . Alzheimer's disease Mother   . Heart disease Father   . Diabetes Father   . Hypertension Father   . Stroke Father   . Allergies Son   . Allergies Sister   . Stomach cancer Paternal Grandmother   . Rectal cancer Maternal Grandmother    Allergies  Allergen Reactions  . Sulfa Antibiotics Other (See Comments)    UNSPECIFIED    Prior to Admission medications   Medication Sig Start Date End Date Taking? Authorizing Provider  ALPRAZolam Duanne Moron) 0.25 MG tablet TK 2 TS PO QD. 06/11/18  Yes [provider]  aspirin EC 81 MG tablet Take 81 mg by mouth daily.   Yes [provider]  diclofenac (VOLTAREN) 75 MG EC tablet Take 1 tablet (75 mg total) by mouth 2 (two) times daily. 12/02/18  Yes Regal, Tamala Fothergill, DPM  fexofenadine (ALLEGRA) 180 MG tablet Take 180 mg by mouth daily.   Yes [provider]   fluticasone (FLONASE) 50 MCG/ACT nasal spray Place 1 spray into both nostrils Daily.  03/14/11  Yes [provider]  meloxicam (MOBIC) 15 MG tablet Take 15 mg by mouth as needed.  08/15/16  Yes [provider]  Multiple Vitamin (MULTIVITAMIN) tablet Take 1 tablet by mouth daily.   Yes [provider]  rosuvastatin (CRESTOR) 10 MG tablet TK 1 T PO QD FOR CHOLESTROL 11/01/18  Yes [provider]  SUMAtriptan (IMITREX) 50 MG tablet Take 50 mg by mouth as needed for migraine. May repeat in 2 hours if headache persists or recurs.   Yes [provider]  tadalafil (CIALIS) 20 MG tablet TK 1 T Q WEEK UTD. 11/20/18  Yes [provider]     Positive ROS: Otherwise negative  All other systems have been reviewed and were otherwise negative with the exception of those mentioned in the HPI and as above.  Physical Exam: Constitutional: Alert, well-appearing, no acute distress Ears: External ears without lesions or tenderness. Ear canals are clear bilaterally with intact, clear TMs.  Nasal: External nose without lesions. Septum slight deviation to the left with mild rhinitis.. Clear nasal passages otherwise. Oral: Lips and gums without lesions. Tongue and palate mucosa without lesions. Posterior oropharynx clear.  Tonsil regions appear benign bilaterally. Fiberoptic laryngoscopy was performed through the right nostril.  The nasopharynx was clear.  The base of tongue vallecula epiglottis were normal.  Piriform sinuses were clear bilaterally.  Vocal cords were clear with normal vocal mobility.  He had mild arytenoid edema.  The fiberoptic laryngoscope was passed through the upper esophageal sphincter without difficulty and the upper cervical esophagus was clear with no mucosal abnormalities noted. Neck: No palpable adenopathy or masses.  No palpable adenopathy or thyroid nodules noted. Respiratory: Breathing comfortably  Skin: No facial/neck lesions or rash  noted.  Laryngoscopy  Date/Time: 12/22/2019 5:47 PM Performed by: Rozetta Nunnery, MD Authorized by: Rozetta Nunnery, MD   Consent:    Consent obtained:  Verbal   Consent given by:  Patient Procedure details:    Indications: direct visualization of the upper aerodigestive tract     Medication:  Afrin   Instrument: flexible fiberoptic laryngoscope     Scope location: right nare   Sinus:    Right nasopharynx: normal   Mouth:    Oropharynx: normal     Vallecula: normal     Base of tongue: normal     Epiglottis: normal  Throat:    Pyriform sinus: normal     True vocal cords: normal   Comments:     On fiberoptic laryngoscopy the hypopharynx and larynx was essentially clear with no evidence of infection or tumor.  He had mild arytenoid edema but minimal erythema.  The fiberoptic laryngoscope was passed through the upper esophageal sphincter without difficulty and the upper esophagus was clear with no obvious lesions noted.    Assessment: Systems consistent with laryngeal pharyngeal reflux.  He has clear upper airway examination on fiberoptic laryngoscopy otherwise with no mucosal abnormalities or evidence of neoplasm.  Plan: Agree with treatment with Prilosec and discussed with him that he would probably be better serviced if he took the Prilosec before dinner as most of his reflux probably occurs at night when he is lying flat and sleeping and this will provide better nighttime coverage of his reflux.   Radene Journey, MD   CC:

## 2020-07-06 ENCOUNTER — Other Ambulatory Visit: Payer: Self-pay | Admitting: Orthopedic Surgery

## 2020-07-06 DIAGNOSIS — M545 Low back pain, unspecified: Secondary | ICD-10-CM

## 2020-07-06 DIAGNOSIS — M75100 Unspecified rotator cuff tear or rupture of unspecified shoulder, not specified as traumatic: Secondary | ICD-10-CM

## 2020-07-06 DIAGNOSIS — M25512 Pain in left shoulder: Secondary | ICD-10-CM

## 2020-07-19 ENCOUNTER — Ambulatory Visit
Admission: RE | Admit: 2020-07-19 | Discharge: 2020-07-19 | Disposition: A | Discharge status: Home / Self Care | Payer: No Typology Code available for payment source | Source: Ambulatory Visit | Attending: Orthopedic Surgery | Admitting: Orthopedic Surgery

## 2020-07-19 DIAGNOSIS — M545 Low back pain, unspecified: Secondary | ICD-10-CM

## 2020-07-19 DIAGNOSIS — M25512 Pain in left shoulder: Secondary | ICD-10-CM

## 2020-07-19 DIAGNOSIS — M75100 Unspecified rotator cuff tear or rupture of unspecified shoulder, not specified as traumatic: Secondary | ICD-10-CM

## 2020-10-16 ENCOUNTER — Telehealth: Payer: Self-pay | Admitting: Internal Medicine

## 2020-10-16 DIAGNOSIS — G4733 Obstructive sleep apnea (adult) (pediatric): Secondary | ICD-10-CM

## 2020-10-16 NOTE — Addendum Note (Signed)
Addended by: Rosana Berger on: 10/16/2020 05:44 PM   Modules accepted: Orders

## 2020-10-16 NOTE — Telephone Encounter (Signed)
CPAP supplies order sent  Pt aware  Nothing further needed

## 2020-10-16 NOTE — Telephone Encounter (Signed)
I have sent in order to replace supplies to [facility]. Patient informed. Nothing further needed.

## 2020-10-16 NOTE — Telephone Encounter (Signed)
Please go ahead and order- DME [facility]- replace hose, mask, filters, water tank and supplies.

## 2020-10-16 NOTE — Telephone Encounter (Signed)
I called and spoke with patient regarding message. Patient stated that [facility] was supposed to send [place] something about needing new supplies regarding hose and water tank and a few other things. I informed patient that I do not see anyone has noted receiving this and he will reach back out to [facility] to re send it. I will send this to Dr. Annamaria Boots for [facility].  Dr. Annamaria Boots, just [facility] for when they send it. Thanks!

## 2020-10-16 NOTE — Telephone Encounter (Signed)
Pt states he uses cpap CY prescribed- pt's dog ate the hose to pt's cpap. Pt uses [facility] for DME. Pt states they sent in an order or rx for hose but haven't heard anything back. Pt needs before going on vacation next week. Please advise [phone]

## 2020-10-16 NOTE — Telephone Encounter (Signed)
Pt states he uses cpap CY prescribed- pt's dog ate the hose to pt's cpap. Pt uses [facility] for DME. Pt states they sent in an order or rx for hose but haven't heard anything back. Pt needs before going on vacation next week. Please advise [phone]

## 2020-11-19 ENCOUNTER — Telehealth: Payer: Self-pay | Admitting: Internal Medicine

## 2020-11-19 NOTE — Telephone Encounter (Signed)
Pt's appt has been changed to a televisit. Attempted to call pt to let him know that this change had been made but unable to reach. Left pt a detailed message letting him know that his appt was changed. Nothing further needed.

## 2020-11-21 NOTE — Progress Notes (Signed)
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 Harold Hedge allergist), GERD Home unattended sleep study 11/08/10 - AHI 28.4/hr -----------------------------------------------------------------------------------   11/23/19-  65 year old male never smoker followed for OSA, complicated by SAR, GERD, CPAP auto 5-15/[facility]- Body weight today- 239 lbs Covid vax- 2 Phizer Flu vax- had Wears CPAP 6-7 hrs/ night.  Very satisfied, no concerns.  11/22/20- Virtual Visit via Telephone Note  I connected with Sanda Linger on 11/21/20 at 10:30 AM EDT by telephone and verified that I am speaking with the correct person using two identifiers.  Location: Patient: home Provider: office   I discussed the limitations, risks, security and privacy concerns of performing an evaluation and management service by telephone and the availability of in person appointments. I also discussed with the patient that there may be a patient responsible charge related to this service. The patient expressed understanding and agreed to proceed.   History of Present Illness:65 year old male never smoker followed for OSA, complicated by SAR, GERD, CPAP auto 5-15/[facility]- lost SD card Body weight today-reports 224 lbs Covid vax-reports 5 Phizer including update booster Flu vax-had Very comfortable with CPAP used every night. No concerns. Machine working well.  Breathing is comfortable and denies interval health concerns.   Observations/Objective:   Assessment and Plan: Continues stable on CPAP 5-15  Follow Up Instructions: 1 year   I discussed the assessment and treatment plan with the patient. The patient was provided an opportunity to ask questions and all were answered. The patient agreed with the plan and demonstrated an understanding of the instructions.   The patient was advised to call back or seek an in-person evaluation if the symptoms worsen or if the  condition fails to improve as anticipated.  I provided 20 minutes of non-face-to-face time during this encounter.   Baird Lyons, MD    ROS-see HPI     + = positive Constitutional:   No-   weight loss, night sweats, fevers, chills, fatigue, lassitude. HEENT:   +  headaches, difficulty swallowing, tooth/dental problems, sore throat,       No-  sneezing, itching, ear ache, +seasonal nasal congestion, post nasal drip,  CV:  No-   chest pain, orthopnea, PND, swelling in lower extremities, anasarca, dizziness, palpitations Resp: No-   shortness of breath with exertion or at rest.              No-   productive cough,  No non-productive cough,  No- coughing up of blood.              No-   change in color of mucus.  No- wheezing.   Skin: No-   rash or lesions. GI:  No-   heartburn, indigestion, abdominal pain, nausea, vomiting,  GU: MS:  No-   joint pain or swelling.     Neuro-     nothing unusual Psych:  No- change in mood or affect. No depression or anxiety.  No memory loss.  OBJ- General- Alert, Oriented, Affect-appropriate, Distress- none acute, tall/ fit Skin- -no rash Lymphadenopathy- none Head- atraumatic            Eyes- Gross vision intact, PERRLA, conjunctivae clear secretions            Ears- Hearing, canals-normal            Nose- Clear, no-Septal dev, mucus, polyps, erosion, perforation             Throat- Mallampati  III , mucosa clear , drainage- none, tonsils- atrophic,  Neck- flexible , trachea midline, no stridor , thyroid nl, carotid no bruit Chest - symmetrical excursion , unlabored           Heart/CV- RRR , no murmur , no gallop  , no rub, nl s1 s2                           - JVD- none , edema- none, stasis changes- none, varices- none           Lung- clear to P&A, wheeze- none, cough- none , dullness-none, rub- none           Chest wall-  Abd-  Br/ Gen/ Rectal- Not done, not indicated Extrem- cyanosis- none, clubbing, none, atrophy- none, strength- nl Neuro-  grossly intact to observation

## 2020-11-22 ENCOUNTER — Encounter: Payer: Self-pay | Admitting: Internal Medicine

## 2020-11-22 ENCOUNTER — Ambulatory Visit (INDEPENDENT_AMBULATORY_CARE_PROVIDER_SITE_OTHER): Payer: No Typology Code available for payment source | Admitting: Internal Medicine

## 2020-11-22 ENCOUNTER — Other Ambulatory Visit: Payer: Self-pay

## 2020-11-22 DIAGNOSIS — G4733 Obstructive sleep apnea (adult) (pediatric): Secondary | ICD-10-CM | POA: Diagnosis not present

## 2020-11-22 DIAGNOSIS — J301 Allergic rhinitis due to pollen: Secondary | ICD-10-CM

## 2020-11-22 NOTE — Patient Instructions (Signed)
Order- DME [facility]   Please replace SD card and send current download. We can continue CPAP auto 5-15, mask of [facility],e humidifier, supplies, AirView/ card  Please call if we can help

## 2020-11-22 NOTE — Assessment & Plan Note (Signed)
He reports good compliance and control Plan- continue CPAP 5-15

## 2020-11-22 NOTE — Assessment & Plan Note (Signed)
Managed by his allergist. Denies seasonal problems interfering with sleep.

## 2020-11-22 NOTE — Addendum Note (Signed)
Addended by: Fenton Foy on: 11/22/2020 11:40 AM   Modules accepted: Orders

## 2021-05-16 NOTE — Progress Notes (Signed)
?  ?Cardiology Office Note ? ? ?Date:  05/22/2021  ? ?ID:  Sanda Linger, DOB 26-Jun-1955, MRN [id number] ? ?PCP:  Lorene Dy, MD  ?Cardiologist:   Orlen Leedy [place], MD  ? ?Chief Complaint  ?Patient presents with  ? artery calcium  ? ? ?  ?History of Present Illness: ?FONG MCCARRY is a 66 y.o. male who is seen at the request of Dr Mancel Bale for evaluation of coronary artery calcification. He has a history of HLD, and OSA on CPAP. He is generally in excellent health. This past year he had Xrays done at his dentist which showed evidence of carotid artery calcification. He has no known CAD. On Crestor for his cholesterol. Denies any chest pain, dyspnea, palpitations, dizziness. No history of TIA or CVA. He is very active exercising 6 days a week and eating a mostly Vegan diet. No fatigue or limitation. Very interested in assessing CV risk. ? ?Past Medical History:  ?Diagnosis Date  ? Anxiety   ? on an airplane  - one time  ? Arthritis   ? Chronic headaches   ? migraines  ? Dizziness   ? Environmental allergies   ? GERD (gastroesophageal reflux disease)   ? High cholesterol   ? History of shingles   ? Pneumonia   ? Sleep apnea   ? uses cpap  ? ? ?Past Surgical History:  ?Procedure Laterality Date  ? APPENDECTOMY    ? EYE SURGERY Bilateral   ? cataract surgery with lens implant on Right  ? INSERTION OF MESH N/A 09/14/2015  ? Procedure: INSERTION OF MESH;  Surgeon: Ralene Ok, MD;  Location: [place];  Service: General;  Laterality: N/A;  ? knee/ankle surgeries    ? ROTATOR CUFF REPAIR    ? UMBILICAL HERNIA REPAIR N/A 09/14/2015  ? Procedure: LAPAROSCOPIC UMBILICAL HERNIA;  Surgeon: Ralene Ok, MD;  Location: [place];  Service: General;  Laterality: N/A;  ? ? ? ?Current Outpatient Medications  ?Medication Sig Dispense Refill  ? ALPRAZolam (XANAX) 0.25 MG tablet TK 2 TS PO QD.    ? diclofenac (VOLTAREN) 75 MG EC tablet Take 1 tablet (75 mg total) by mouth 2 (two) times daily. 50 tablet 2  ? fexofenadine (ALLEGRA) 180 MG  tablet Take 180 mg by mouth daily.    ? fluticasone (FLONASE) 50 MCG/ACT nasal spray Place 1 spray into both nostrils Daily.     ? meloxicam (MOBIC) 15 MG tablet Take 15 mg by mouth as needed.   0  ? Multiple Vitamin (MULTIVITAMIN) tablet Take 1 tablet by mouth daily.    ? rosuvastatin (CRESTOR) 10 MG tablet TK 1 T PO QD FOR CHOLESTROL    ? SUMAtriptan (IMITREX) 50 MG tablet Take 50 mg by mouth as needed for migraine. May repeat in 2 hours if headache persists or recurs.    ? tadalafil (CIALIS) 20 MG tablet TK 1 T Q WEEK UTD.    ? ?No current facility-administered medications for this visit.  ? ? ?Allergies:   Sulfa antibiotics  ? ? ?Social History:  The patient  reports that he has never smoked. He has never used smokeless tobacco. He reports current alcohol use of about 2.0 standard drinks per week. He reports that he does not use drugs.  ? ?Family History:  The patient's family history includes Allergies in his sister and son; Alzheimer's disease in his mother; Diabetes in his father; Heart disease (age of onset: 62) in his father; Hypertension in his father; Rectal  cancer in his maternal grandmother; Stomach cancer in his paternal grandmother; Stroke in his father.  ? ? ?ROS:  Please see the history of present illness.   Otherwise, review of systems are positive for none.   All other systems are reviewed and negative.  ? ? ?PHYSICAL EXAM: ?VS:  BP 130/80 (BP Location: Left Arm)   Pulse (!) 49   Ht '6\' 2"'$  (1.88 m)   Wt 216 lb 6.4 oz (98.2 kg)   SpO2 97%   BMI 27.78 kg/m?  , BMI Body mass index is 27.78 kg/m?. ?GEN: Well nourished, well developed, in no acute distress ?HEENT: normal ?Neck: no JVD, carotid bruits, or masses ?Cardiac: RRR; no murmurs, rubs, or gallops,no edema  ?Respiratory:  clear to auscultation bilaterally, normal work of breathing ?GI: soft, nontender, nondistended, + BS ?MS: no deformity or atrophy ?Skin: warm and dry, no rash ?Neuro:  Strength and sensation are intact ?Psych: euthymic  mood, full affect ? ? ?EKG:  EKG is ordered today. ?The ekg ordered today demonstrates NSR rate 49, normal. I have personally reviewed and interpreted this study. ? ? ? ?Recent Labs: ?No results found for requested labs within last 8760 hours.  ? ?Dated 03/12/21: CBC, TSH, PSA, CMET all normal. A1c 5.2%. cholesterol 141, triglycerides 64, HDL 50, LDL 77.  ?Lipid Panel ?No results found for: CHOL, TRIG, HDL, CHOLHDL, VLDL, LDLCALC, LDLDIRECT ?  ? ?Wt Readings from Last 3 Encounters:  ?05/22/21 216 lb 6.4 oz (98.2 kg)  ?11/22/20 224 lb (101.6 kg)  ?11/23/19 239 lb 3.2 oz (108.5 kg)  ?  ? ? ?Other studies Reviewed: ?Additional studies/ records that were reviewed today include: none. ?Review of the above records demonstrates: N/A ? ? ?ASSESSMENT AND PLAN: ? ?1.  Carotid artery calcification noted on X ray. Will arrange for carotid US ?2. Hypercholesterolemia. Doing all the right lifestyle modifications. On Crestor. If he does have evidence of vascular disease would target LDL < 70. To assess CV risk will arrange for a coronary calcium score.  ? ? ?Current medicines are reviewed at length with the patient today.  The patient does not have concerns regarding medicines. ? ?The following changes have been made:  no change ? ?Labs/ tests ordered today include:  ? ?Orders Placed This Encounter  ?Procedures  ? EKG 12-Lead  ? VAS US CAROTID  ? ? ?   ? ? ?Disposition:   FU with me after above ? ?Signed, ?Derriana Oser [place], MD  ?05/22/2021 1:32 PM    ?[facility] ?[address], [place], [place], [address] ?Phone [phone], Fax [phone] ? ? ?

## 2021-05-22 ENCOUNTER — Encounter: Payer: Self-pay | Admitting: Cardiology

## 2021-05-22 ENCOUNTER — Ambulatory Visit (INDEPENDENT_AMBULATORY_CARE_PROVIDER_SITE_OTHER): Payer: Medicare Other | Admitting: Cardiology

## 2021-05-22 VITALS — BP 130/80 | HR 49 | Ht 74.0 in | Wt 216.4 lb

## 2021-05-22 DIAGNOSIS — I779 Disorder of arteries and arterioles, unspecified: Secondary | ICD-10-CM | POA: Diagnosis not present

## 2021-05-22 DIAGNOSIS — E7800 Pure hypercholesterolemia, unspecified: Secondary | ICD-10-CM

## 2021-05-22 NOTE — Addendum Note (Signed)
Addended by: Kathyrn Lass on: 05/22/2021 01:47 PM ? ? Modules accepted: Orders ? ?

## 2021-05-22 NOTE — Patient Instructions (Signed)
Medication Instructions:  ?Continue same medications ? ? ?Lab Work: ?None ordered ? ? ?Testing/Procedures: ?Carotid dopplers ? ?Coronary Calcium Score ? ? ?Follow-Up: ?At [facility], you and your health needs are our priority.  As part of our continuing mission to provide you with exceptional heart care, we have created designated Provider Care Teams.  These Care Teams include your primary Cardiologist (physician) and Advanced Practice Providers (APPs -  Physician Assistants and Nurse Practitioners) who all work together to provide you with the care you need, when you need it. ? ?We recommend signing up for the patient portal called "MyChart".  Sign up information is provided on this After Visit Summary.  MyChart is used to connect with patients for Virtual Visits (Telemedicine).  Patients are able to view lab/test results, encounter notes, upcoming appointments, etc.  Non-urgent messages can be sent to your provider as well.   ?To learn more about what you can do with MyChart, go to [email or web address].   ? ?Your next appointment:  To Be Determined after test ?  ? ?The format for your next appointment: Office ? ? ?Provider:  Dr.Jordan ? ? ?Important Information About Sugar ? ? ? ? ? ? ?

## 2021-05-24 ENCOUNTER — Ambulatory Visit (HOSPITAL_COMMUNITY)
Admission: RE | Admit: 2021-05-24 | Discharge: 2021-05-24 | Disposition: A | Discharge status: Home / Self Care | Payer: Medicare Other | Source: Ambulatory Visit | Attending: Cardiovascular Disease | Admitting: Cardiovascular Disease

## 2021-05-24 DIAGNOSIS — I779 Disorder of arteries and arterioles, unspecified: Secondary | ICD-10-CM

## 2021-05-24 DIAGNOSIS — E7800 Pure hypercholesterolemia, unspecified: Secondary | ICD-10-CM | POA: Insufficient documentation

## 2021-05-29 ENCOUNTER — Ambulatory Visit (HOSPITAL_BASED_OUTPATIENT_CLINIC_OR_DEPARTMENT_OTHER): Payer: Medicare Other

## 2021-06-06 ENCOUNTER — Encounter (HOSPITAL_BASED_OUTPATIENT_CLINIC_OR_DEPARTMENT_OTHER): Payer: Self-pay

## 2021-06-06 ENCOUNTER — Ambulatory Visit (HOSPITAL_BASED_OUTPATIENT_CLINIC_OR_DEPARTMENT_OTHER)
Admission: RE | Admit: 2021-06-06 | Discharge: 2021-06-06 | Disposition: A | Discharge status: Home / Self Care | Payer: Self-pay | Source: Ambulatory Visit | Attending: Cardiology | Admitting: Cardiology

## 2021-06-06 ENCOUNTER — Other Ambulatory Visit: Payer: Self-pay

## 2021-06-06 DIAGNOSIS — E7800 Pure hypercholesterolemia, unspecified: Secondary | ICD-10-CM | POA: Insufficient documentation

## 2021-06-06 DIAGNOSIS — I779 Disorder of arteries and arterioles, unspecified: Secondary | ICD-10-CM | POA: Insufficient documentation

## 2021-06-06 MED ORDER — ROSUVASTATIN CALCIUM 20 MG PO TABS: 20.0000 mg | ORAL_TABLET | Freq: Every day | ORAL | 3 refills | Status: DC

## 2021-07-22 ENCOUNTER — Telehealth: Payer: Self-pay | Admitting: Cardiology

## 2021-07-22 NOTE — Telephone Encounter (Signed)
*  STAT* If patient is at the pharmacy, call can be transferred to refill team.   1. Which medications need to be refilled? (please list name of each medication and dose if known) rosuvastatin (CRESTOR) 20 MG tablet  2. Which pharmacy/location (including street and city if local pharmacy) is medication to be sent to? [id number]- [place], [place] - [address]. Do they need a 30 day or 90 day supply? [address]

## 2021-07-24 ENCOUNTER — Other Ambulatory Visit: Payer: Self-pay

## 2021-07-24 MED ORDER — ROSUVASTATIN CALCIUM 40 MG PO TABS: 40.0000 mg | ORAL_TABLET | Freq: Every day | ORAL | 3 refills | Status: AC

## 2021-07-24 NOTE — Telephone Encounter (Signed)
Pt c/o medication issue:  1. Name of Medication: Rosuvastatin 40 mg  2. How are you currently taking this medication (dosage and times per day)? 2x 20 mg daily  3. Are you having a reaction (difficulty breathing--STAT)?   4. What is your medication issue? Patient is requesting call back in regards to this medication. He sent in a refill request recently for 20 mg, but believes he was actually suppose to get a 40 mg dose increase of this per doctor. Patient is requesting a call back to get a clarification of this and also to get another refill sent in for this.

## 2021-07-24 NOTE — Telephone Encounter (Signed)
Left message to call back  

## 2021-07-24 NOTE — Telephone Encounter (Signed)
Pt called stating he thought his crestor was supposed to be increased to 40 mg daily. Pt advised that per chart review, pt was previously taking 10 mg and on 4/27 MD increased to 20 mg daily. Pt state he believe he was already taking 20 mg, however, has to take another call but will check all prescription and call back to update.   Peter M Martinique, MD  06/06/2021  4:14 PM EDT     This study demonstrates:  coronary calcium score is 47. Not bad and pretty average for his age.  Medication changes / Follow up studies / Other recommendations:   Ideally would like to see LDL < 70. Could increase Crestor to 20 mg or else work harder on diet and exercise. Either way would repeat lab in 3-4 months   Please send results to the PCP:  Lorene Dy, MD  Peter Martinique, MD 06/06/2021 4:12 PM

## 2021-07-24 NOTE — Telephone Encounter (Addendum)
Patient returned call.  He doubled checked and he has been on 20 mg for the past two years, so if he needs to double crestor it needs to increase to '40mg'$ .  If you have any questions please feel free to call him.

## 2021-07-24 NOTE — Telephone Encounter (Signed)
Pt is returning call and requesting call back.  

## 2021-07-24 NOTE — Telephone Encounter (Signed)
Called patient, advised of note from MD.  RX updated and sent to pharmacy.  Thanks!

## 2021-11-25 ENCOUNTER — Ambulatory Visit: Payer: No Typology Code available for payment source | Admitting: Internal Medicine

## 2021-12-15 NOTE — Progress Notes (Unsigned)
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 Harold Hedge allergist), GERD Home unattended sleep study 11/08/10 - AHI 28.4/hr -----------------------------------------------------------------------------------  11/22/20- Virtual Visit via Telephone Note  I connected with Corey Hensley on 11/21/20 at 10:30 AM EDT by telephone and verified that I am speaking with the correct person using two identifiers.  Location: Patient: home Provider: office   I discussed the limitations, risks, security and privacy concerns of performing an evaluation and management service by telephone and the availability of in person appointments. I also discussed with the patient that there may be a patient responsible charge related to this service. The patient expressed understanding and agreed to proceed.   History of Present Illness:66 year old male never smoker followed for OSA, complicated by SAR, GERD, CPAP auto 5-15/[facility] Download- lost SD card Body weight today-reports 224 lbs Covid vax-reports 5 Phizer including update booster Flu vax-had Very comfortable with CPAP used every night. No concerns. Machine working well.  Breathing is comfortable and denies interval health concerns.   Observations/Objective:   Assessment and Plan: Continues stable on CPAP 5-15  Follow Up Instructions: 1 year   I discussed the assessment and treatment plan with the patient. The patient was provided an opportunity to ask questions and all were answered. The patient agreed with the plan and demonstrated an understanding of the instructions.   The patient was advised to call back or seek an in-person evaluation if the symptoms worsen or if the condition fails to improve as anticipated.  I provided 20 minutes of non-face-to-face time during this encounter.   Baird Lyons, MD  111/7/23- 66 year old male never smoker followed for OSA, complicated by SAR, GERD, Carotid Artery Disease,  Hyperlipidemia,  CPAP auto 5-15/[facility]>> [facility] Download compliance-  Body weight today-reports Covid vax-reports 5 Phizer  Flu vax-pending He reports being "semiretired" now which has helped him lose some weight.  He feels he is doing well with CPAP use routinely every night and sleeping well.  Other health problems are controlled with no emergent issues. His previous DME company reportedly is getting out of the CPAP business and has referred him to [facility].   ROS-see HPI     + = positive Constitutional:   + weight loss, night sweats, fevers, chills, fatigue, lassitude. HEENT:   +  headaches, difficulty swallowing, tooth/dental problems, sore throat,       No-  sneezing, itching, ear ache, +seasonal nasal congestion, post nasal drip,  CV:  No-   chest pain, orthopnea, PND, swelling in lower extremities, anasarca, dizziness, palpitations Resp: No-   shortness of breath with exertion or at rest.              No-   productive cough,  No non-productive cough,  No- coughing up of blood.              No-   change in color of mucus.  No- wheezing.   Skin: No-   rash or lesions. GI:  No-   heartburn, indigestion, abdominal pain, nausea, vomiting,  GU: MS:  No-   joint pain or swelling.     Neuro-     nothing unusual Psych:  No- change in mood or affect. No depression or anxiety.  No memory loss.  OBJ- General- Alert, Oriented, Affect-appropriate, Distress- none acute, tall/ fit Skin- -no rash Lymphadenopathy- none Head- atraumatic            Eyes- Gross vision intact, PERRLA, conjunctivae clear secretions  Ears- Hearing, canals-normal            Nose- Clear, no-Septal dev, mucus, polyps, erosion, perforation             Throat- Mallampati III , mucosa clear , drainage- none, tonsils- atrophic,  Neck- flexible , trachea midline, no stridor , thyroid nl, carotid no bruit Chest - symmetrical excursion , unlabored           Heart/CV- RRR , no murmur , no gallop  , no rub,  nl s1 s2                           - JVD- none , edema- none, stasis changes- none, varices- none           Lung- clear to P&A, wheeze- none, cough- none , dullness-none, rub- none           Chest wall-  Abd-  Br/ Gen/ Rectal- Not done, not indicated Extrem- cyanosis- none, clubbing, none, atrophy- none, strength- nl Neuro- grossly intact to observation

## 2021-12-17 ENCOUNTER — Encounter: Payer: Self-pay | Admitting: Internal Medicine

## 2021-12-17 ENCOUNTER — Ambulatory Visit (INDEPENDENT_AMBULATORY_CARE_PROVIDER_SITE_OTHER): Payer: Medicare Other | Admitting: Internal Medicine

## 2021-12-17 VITALS — BP 112/70 | HR 50 | Ht 74.0 in | Wt 223.6 lb

## 2021-12-17 DIAGNOSIS — G4733 Obstructive sleep apnea (adult) (pediatric): Secondary | ICD-10-CM | POA: Diagnosis not present

## 2021-12-17 NOTE — Patient Instructions (Signed)
Order- [facility]- please change DME from [facility] to [facility]- continue CPAP auto 5-15, mask of choice, humidifier, supplies, and please add AirView/ card  Please call if we can help

## 2021-12-18 ENCOUNTER — Encounter: Payer: Self-pay | Admitting: Internal Medicine

## 2021-12-18 NOTE — Assessment & Plan Note (Signed)
Good compliance and control, benefiting from CPAP. Plan-recognize [facility] DME is getting out of the CPAP business by report and has referred him to [facility]. We will send supporting orders continuing auto 5-15 and asking for Air View/card

## 2022-12-21 NOTE — Progress Notes (Signed)
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 Irena Cords allergist), GERD Home unattended sleep study 11/08/10 - AHI 28.4/hr -----------------------------------------------------------------------------------   111/7/23- 66 year old male never smoker followed for OSA, complicated by SAR, GERD, Carotid Artery Disease, Hyperlipidemia,  CPAP auto 5-15/[facility]>> [facility] Download compliance-  Body weight today-reports Covid vax-reports 5 Phizer  Flu vax-pending He reports being "semiretired" now which has helped him lose some weight.  He feels he is doing well with CPAP use routinely every night and sleeping well.  Other health problems are controlled with no emergent issues. His previous DME company reportedly is getting out of the CPAP business and has referred him to [facility].  12/23/22- 67 year old male never smoker followed for OSA, complicated by SAR, GERD, Carotid Artery Disease, Hyperlipidemia,  CPAP auto 5-15/ [facility] Download compliance- 98%, AHI 2.2/hr Body weight today-226 lbs Had flu vax. Download reviewed. Doing well with CPAP. Semi-retired now. Exercising a lot  ROS-see HPI     + = positive Constitutional:   + weight loss, night sweats, fevers, chills, fatigue, lassitude. HEENT:   +  headaches, difficulty swallowing, tooth/dental problems, sore throat,       No-  sneezing, itching, ear ache, +seasonal nasal congestion, post nasal drip,  CV:  No-   chest pain, orthopnea, PND, swelling in lower extremities, anasarca, dizziness, palpitations Resp: No-   shortness of breath with exertion or at rest.              No-   productive cough,  No non-productive cough,  No- coughing up of blood.              No-   change in color of mucus.  No- wheezing.   Skin: No-   rash or lesions. GI:  No-   heartburn, indigestion, abdominal pain, nausea, vomiting,  GU: MS:  No-   joint pain or swelling.     Neuro-     nothing unusual Psych:  No- change in mood or affect.  No depression or anxiety.  No memory loss.  OBJ- General- Alert, Oriented, Affect-appropriate, Distress- none acute, tall/ fit Skin- -no rash Lymphadenopathy- none Head- atraumatic            Eyes- Gross vision intact, PERRLA, conjunctivae clear secretions            Ears- Hearing, canals-normal            Nose- Clear, no-Septal dev, mucus, polyps, erosion, perforation             Throat- Mallampati III , mucosa clear , drainage- none, tonsils- atrophic,  Neck- flexible , trachea midline, no stridor , thyroid nl, carotid no bruit Chest - symmetrical excursion , unlabored           Heart/CV- RRR , no murmur , no gallop  , no rub, nl s1 s2                           - JVD- none , edema- none, stasis changes- none, varices- none           Lung- clear to P&A, wheeze- none, cough- none , dullness-none, rub- none           Chest wall-  Abd-  Br/ Gen/ Rectal- Not done, not indicated Extrem- cyanosis- none, clubbing, none, atrophy- none, strength- nl Neuro- grossly intact to observation

## 2022-12-23 ENCOUNTER — Ambulatory Visit: Payer: Medicare Other | Admitting: Internal Medicine

## 2022-12-23 ENCOUNTER — Encounter: Payer: Self-pay | Admitting: Internal Medicine

## 2022-12-23 VITALS — BP 113/70 | HR 41 | Ht 74.0 in | Wt 226.6 lb

## 2022-12-23 DIAGNOSIS — G4733 Obstructive sleep apnea (adult) (pediatric): Secondary | ICD-10-CM | POA: Diagnosis not present

## 2022-12-23 DIAGNOSIS — J301 Allergic rhinitis due to pollen: Secondary | ICD-10-CM | POA: Diagnosis not present

## 2022-12-23 NOTE — Patient Instructions (Signed)
We can continue CPAP auto 5-15 ° °Please call if we can help °

## 2023-02-06 ENCOUNTER — Encounter: Payer: Self-pay | Admitting: Internal Medicine

## 2023-02-06 NOTE — Assessment & Plan Note (Signed)
Benefit from CPAP  Plan- continue CPAP auto 5-15

## 2023-02-06 NOTE — Assessment & Plan Note (Signed)
Managed by hi allergist. Not interfering with his CPAP use.

## 2023-08-20 IMAGING — CT CT CARDIAC CORONARY ARTERY CALCIUM SCORE
3 series · 14 of 20 positions shown, 16 images · non-contrast
Comparison: None.
COMPARISON: None.

Addendum:
EXAM:
OVER-READ INTERPRETATION  CT CHEST

The following report is an over-read performed by radiologist Dr.
Arkadiy Malpica [[email or web address]] on 06/06/2021. This over-read
does not include interpretation of cardiac or coronary anatomy or
pathology. The calcium score interpretation by the cardiologist is
attached.
CLINICAL DATA: Cardiovascular Disease Risk stratification
Coronary Calcium Score
TECHNIQUE: A gated, non-contrast computed tomography scan of the heart was
performed using 3mm slice thickness. Axial images were analyzed on a
dedicated workstation. Calcium scoring of the coronary arteries was
performed using the Agatston method.

[Series 2: ax lung · axial · 0.89mm/px · z∈[+1017,+1163]mm · 6 of 103 slices shown]
[im 15/103  lung]
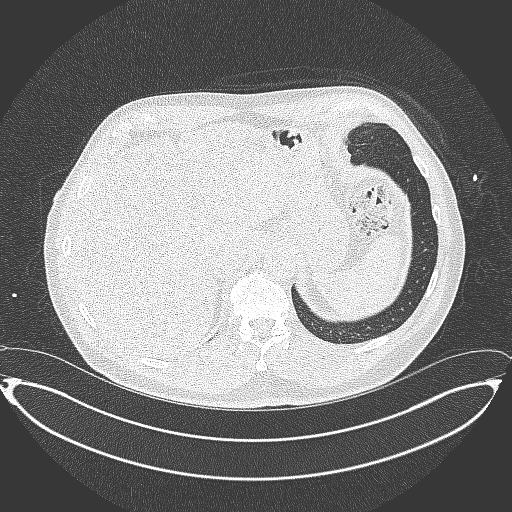
[im 30/103  lung]
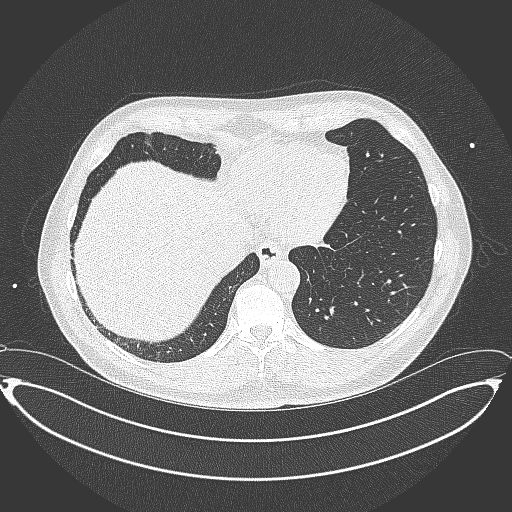
[im 44/103  lung]
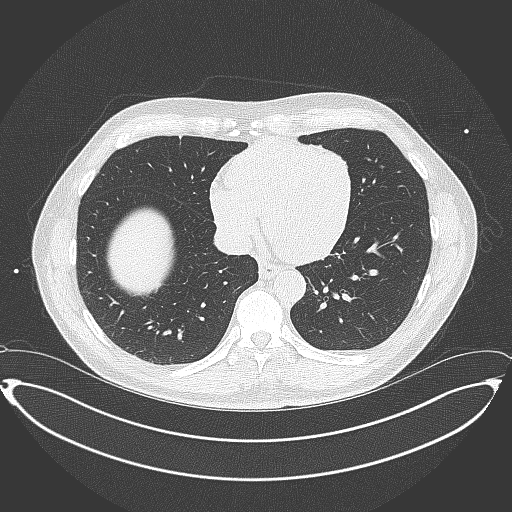
[im 59/103  lung]
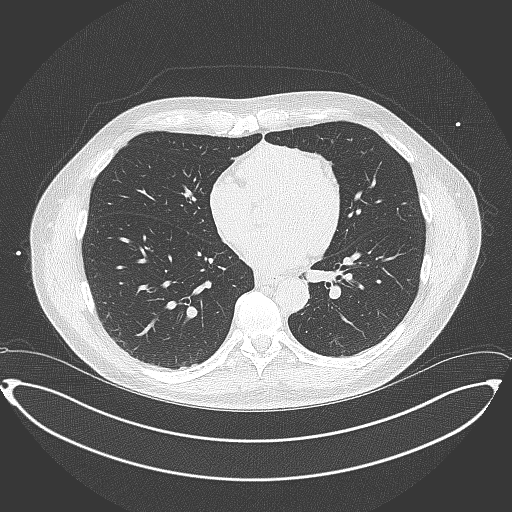
[im 73/103  lung]
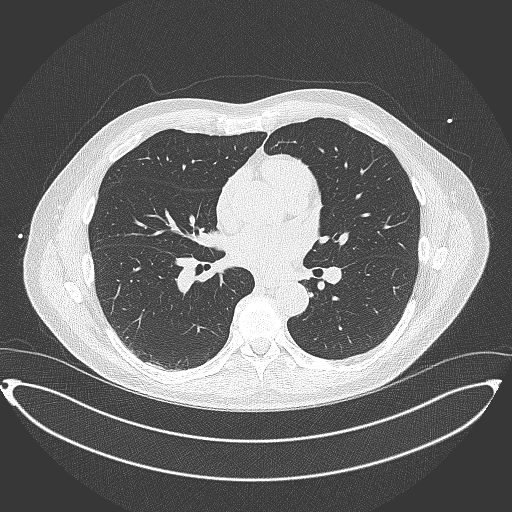
[im 88/103  lung]
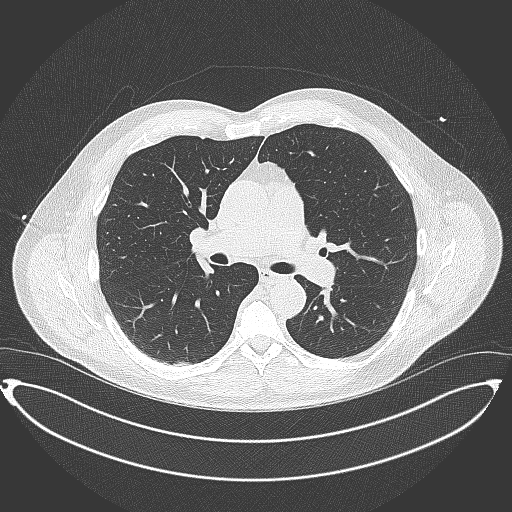

[Series 3: cascseq 3.0 sa36 70% (id) · axial · 0.39mm/px · z∈[+1039,+1141]mm · 3 of 69 slices shown]
[im 18/69  vessel]
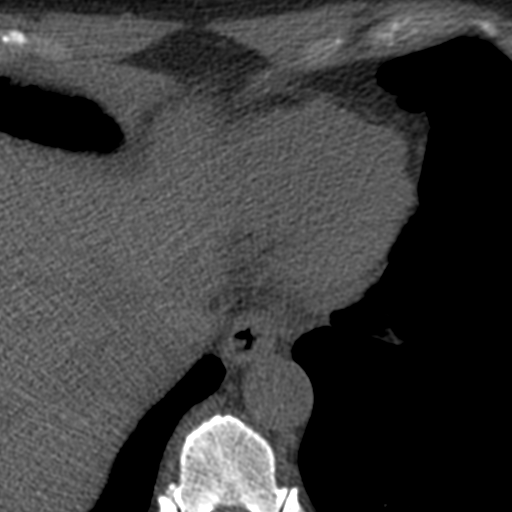
[im 35/69  vessel]
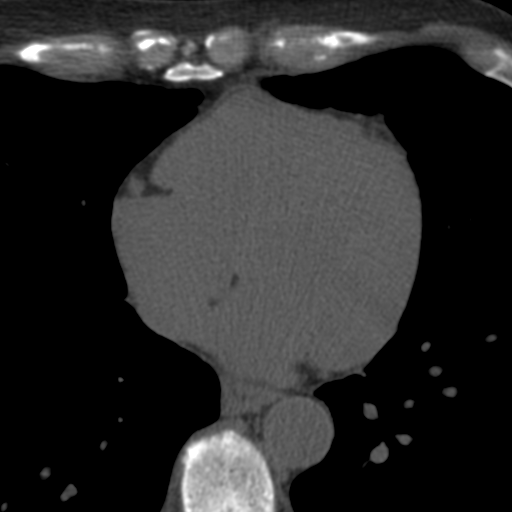
[im 52/69  vessel]
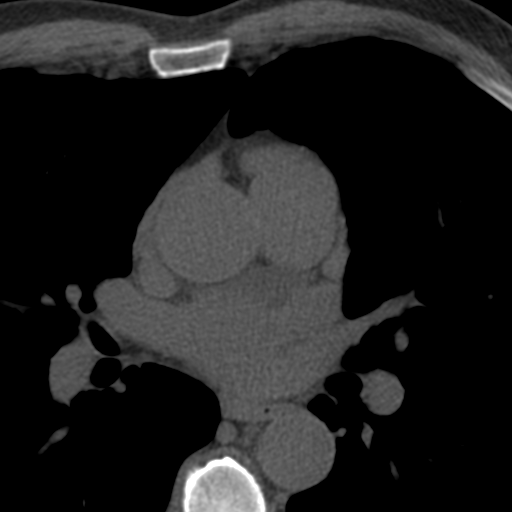

[Series 4: ax st · axial · 0.89mm/px · z∈[+1023,+1159]mm · 5 of 103 slices shown, 7 images]
[im 18/103  vessel]
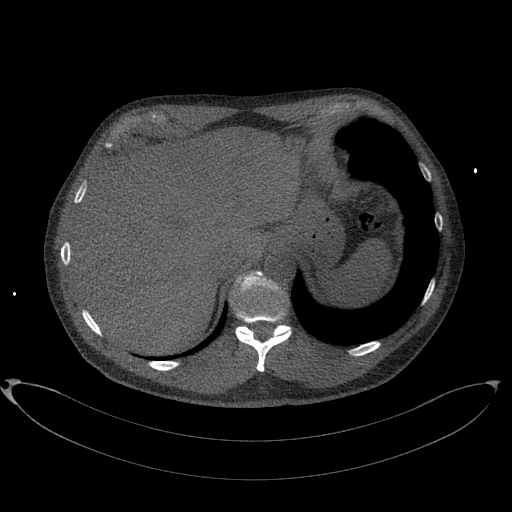
[im 18/103  lung]
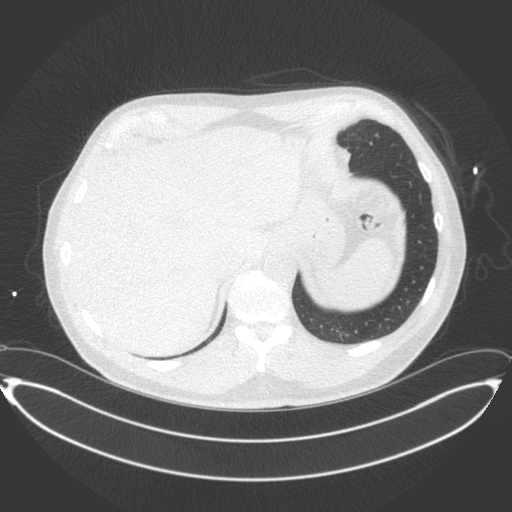
[im 35/103  vessel]
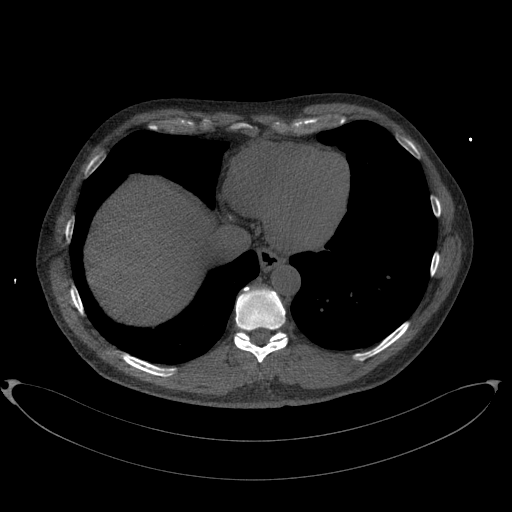
[im 52/103  vessel]
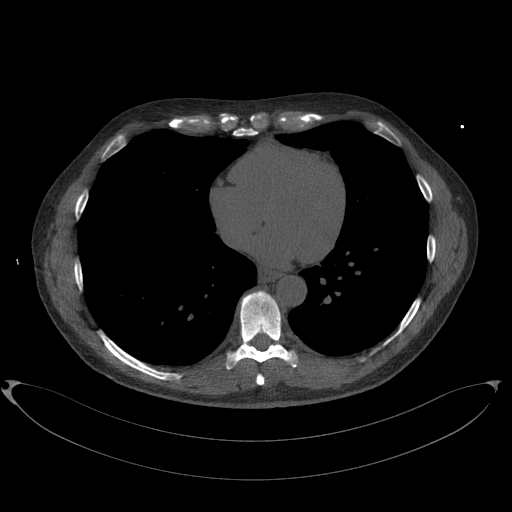
[im 69/103  vessel]
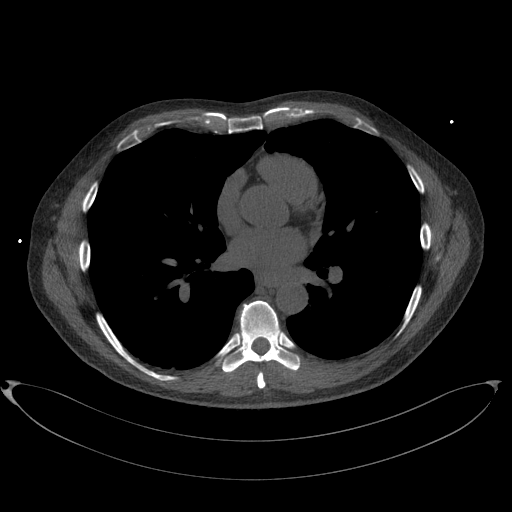
[im 86/103  vessel]
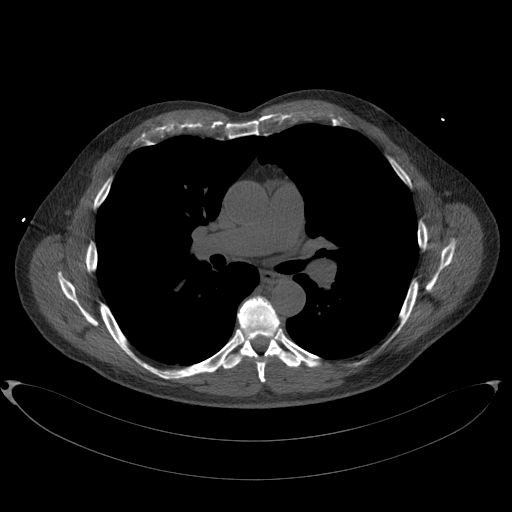
[im 86/103  lung]
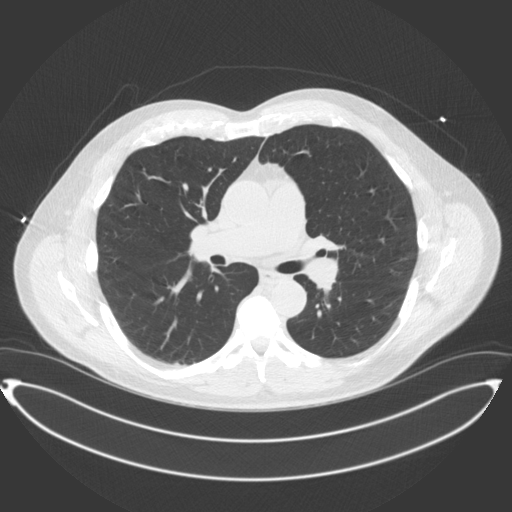

[14 of 20 positions shown; findings below may reference images not displayed]

FINDINGS: Vascular: Aortic atherosclerosis.

Mediastinum/Nodes: No imaged thoracic adenopathy.

Lungs/Pleura: No pleural fluid. Subpleural lymph nodes within the
left lower lobe, including at 6 mm on 83/2.

Upper Abdomen: Normal imaged portions of the liver, spleen, stomach,
pancreas, adrenal glands, kidneys.

Musculoskeletal: No acute osseous abnormality.
IMPRESSION: No acute findings in the imaged extracardiac chest.

Aortic Atherosclerosis (CPR71-CK0.0).
FINDINGS: Coronary Calcium Score:

Left main: 0

Left anterior descending artery: 21

Left circumflex artery: 0

Right coronary artery:

Total:

Percentile: 43

Pericardium: Normal.

Ascending Aorta: Mildly dilated (39 mm).

Mildly dilated pulmonary artery (32 mm).

Non-cardiac: See separate report from [REDACTED].
IMPRESSION: Coronary calcium score of 47.6. This was 43 percentile for age-,
race-, and sex-matched controls.

Mildly dilated aortic root (39 mm) and pulmonary artery (32 mm).



If CAC=0, it is reasonable to withhold statin therapy and reassess
in 5 to 10 years, as long as higher risk conditions are absent
(diabetes mellitus, family history of premature CHD in first degree
relatives (males <55 years; females <65 years), cigarette smoking,
or LDL >=190 mg/dL).

If CAC is 1 to 99, it is reasonable to initiate statin therapy for
patients >=55 years of age.

If CAC is >=100 or >=75th percentile, it is reasonable to initiate
statin therapy at any age.

Cardiology referral should be considered for patients with CAC
scores >=400 or >=75th percentile.

*0440 AHA/ACC/AACVPR/AAPA/ABC/GABA/KUHAIL/DAQLI/Tarla/SEBAS/TECHTRIS/MV
Guideline on the Management of Blood Cholesterol: A Report of the
American College of Cardiology/American Heart Association Task Force
on Clinical Practice Guidelines. J Am Coll Cardiol.
9023;73(24):8727-8625.

*** End of Addendum ***
EXAM:
OVER-READ INTERPRETATION  CT CHEST

The following report is an over-read performed by radiologist Dr.
Arkadiy Malpica [REDACTED] on 06/06/2021. This over-read
does not include interpretation of cardiac or coronary anatomy or
pathology. The calcium score interpretation by the cardiologist is
attached.
FINDINGS: Vascular: Aortic atherosclerosis.

Mediastinum/Nodes: No imaged thoracic adenopathy.

Lungs/Pleura: No pleural fluid. Subpleural lymph nodes within the
left lower lobe, including at 6 mm on 83/2.

Upper Abdomen: Normal imaged portions of the liver, spleen, stomach,
pancreas, adrenal glands, kidneys.

Musculoskeletal: No acute osseous abnormality.
IMPRESSION: No acute findings in the imaged extracardiac chest.

Aortic Atherosclerosis (CPR71-CK0.0).

## 2023-12-14 NOTE — Progress Notes (Unsigned)
 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 Fleeta Smock allergist), GERD Home unattended sleep study 11/08/10 - AHI 28.4/hr -----------------------------------------------------------------------------------   12/23/22- 68 year old male never smoker followed for OSA, complicated by SAR, GERD, Carotid Artery Disease, Hyperlipidemia,  CPAP auto 5-15/ [facility] Download compliance- 98%, AHI 2.2/hr Body weight today-226 lbs Had flu vax. Download reviewed. Doing well with CPAP. Semi-retired now. Exercising a lot  12/15/23- 68 year old male never smoker followed for OSA, complicated by SAR, GERD, Carotid Artery Disease, Hyperlipidemia,  CPAP auto 5-15/ [facility] Download compliance- 93%, AHI 2/hr Body weight today-233 lbs Semi retired. Exercises regularly. Sleeping well. CPAP is helpful. Discussed the use of AI scribe software for clinical note transcription with the patient, who gave verbal consent to proceed.  History of Present Illness        ROS-see HPI     + = positive Constitutional:   + weight loss, night sweats, fevers, chills, fatigue, lassitude. HEENT:   +  headaches, difficulty swallowing, tooth/dental problems, sore throat,       No-  sneezing, itching, ear ache, +seasonal nasal congestion, post nasal drip,  CV:  No-   chest pain, orthopnea, PND, swelling in lower extremities, anasarca, dizziness, palpitations Resp: No-   shortness of breath with exertion or at rest.              No-   productive cough,  No non-productive cough,  No- coughing up of blood.              No-   change in color of mucus.  No- wheezing.   Skin: No-   rash or lesions. GI:  No-   heartburn, indigestion, abdominal pain, nausea, vomiting,  GU: MS:  No-   joint pain or swelling.     Neuro-     nothing unusual Psych:  No- change in mood or affect. No depression or anxiety.  No memory loss.  OBJ- General- Alert, Oriented, Affect-appropriate, Distress- none acute, tall/ fit Skin-  -no rash Lymphadenopathy- none Head- atraumatic            Eyes- Gross vision intact, PERRLA, conjunctivae clear secretions            Ears- Hearing, canals-normal            Nose- Clear, no-Septal dev, mucus, polyps, erosion, perforation             Throat- Mallampati III , mucosa clear , drainage- none, tonsils- atrophic,  Neck- flexible , trachea midline, no stridor , thyroid  nl, carotid no bruit Chest - symmetrical excursion , unlabored           Heart/CV- RRR , no murmur , no gallop  , no rub, nl s1 s2                           - JVD- none , edema- none, stasis changes- none, varices- none           Lung- clear to P&A, wheeze- none, cough- none , dullness-none, rub- none           Chest wall-  Abd-  Br/ Gen/ Rectal- Not done, not indicated Extrem- cyanosis- none, clubbing, none, atrophy- none, strength- nl Neuro- grossly intact to observation

## 2023-12-15 ENCOUNTER — Ambulatory Visit: Admitting: Internal Medicine

## 2023-12-15 ENCOUNTER — Encounter: Payer: Self-pay | Admitting: Internal Medicine

## 2023-12-15 VITALS — BP 132/78 | HR 50 | Temp 98.2°F | Ht 74.0 in | Wt 233.4 lb

## 2023-12-15 DIAGNOSIS — J301 Allergic rhinitis due to pollen: Secondary | ICD-10-CM

## 2023-12-15 DIAGNOSIS — G4733 Obstructive sleep apnea (adult) (pediatric): Secondary | ICD-10-CM

## 2023-12-15 DIAGNOSIS — J302 Other seasonal allergic rhinitis: Secondary | ICD-10-CM

## 2023-12-15 NOTE — Patient Instructions (Signed)
 Glad you are doing well- we can continue CPAP auto 5-15, mask of choice, supplies

## 2023-12-18 NOTE — Assessment & Plan Note (Signed)
 Managed by Allergist and not affecting sleep now.

## 2023-12-18 NOTE — Assessment & Plan Note (Signed)
 Benefits from CPAP with good compliance and control Plan- continue CPAP. He asks to delay f/u for 2 years.

## 2023-12-25 ENCOUNTER — Ambulatory Visit: Payer: PRIVATE HEALTH INSURANCE | Admitting: Internal Medicine
